# Patient Record
Sex: Male | Born: 1945 | Race: White | Hispanic: No | Smoking: Current every day smoker
Health system: Southern US, Community
[De-identification: ages and names within clinical notes are randomized; demographics above are authoritative.]

## PROBLEM LIST (undated history)

## (undated) DIAGNOSIS — J449 Chronic obstructive pulmonary disease, unspecified: Secondary | ICD-10-CM

## (undated) DIAGNOSIS — M545 Low back pain, unspecified: Secondary | ICD-10-CM

## (undated) DIAGNOSIS — F329 Major depressive disorder, single episode, unspecified: Secondary | ICD-10-CM

## (undated) DIAGNOSIS — Z720 Tobacco use: Secondary | ICD-10-CM

## (undated) DIAGNOSIS — Z972 Presence of dental prosthetic device (complete) (partial): Secondary | ICD-10-CM

## (undated) DIAGNOSIS — R972 Elevated prostate specific antigen [PSA]: Secondary | ICD-10-CM

## (undated) DIAGNOSIS — K759 Inflammatory liver disease, unspecified: Secondary | ICD-10-CM

## (undated) DIAGNOSIS — I10 Essential (primary) hypertension: Secondary | ICD-10-CM

## (undated) DIAGNOSIS — F32A Depression, unspecified: Secondary | ICD-10-CM

## (undated) DIAGNOSIS — H409 Unspecified glaucoma: Secondary | ICD-10-CM

## (undated) HISTORY — DX: Major depressive disorder, single episode, unspecified: F32.9

## (undated) HISTORY — PX: COLONOSCOPY: SHX174

## (undated) HISTORY — PX: OTHER SURGICAL HISTORY: SHX169

## (undated) HISTORY — DX: Essential (primary) hypertension: I10

## (undated) HISTORY — PX: TONSILLECTOMY: SUR1361

## (undated) HISTORY — DX: Inflammatory liver disease, unspecified: K75.9

## (undated) HISTORY — PX: BACK SURGERY: SHX140

## (undated) HISTORY — DX: Unspecified glaucoma: H40.9

## (undated) HISTORY — DX: Depression, unspecified: F32.A

## (undated) HISTORY — DX: Elevated prostate specific antigen (PSA): R97.20

---

## 2006-12-03 ENCOUNTER — Emergency Department (HOSPITAL_COMMUNITY): Admission: EM | Admit: 2006-12-03 | Discharge: 2006-12-03 | Payer: Self-pay | Admitting: Emergency Medicine

## 2011-11-16 ENCOUNTER — Ambulatory Visit: Payer: Self-pay | Admitting: Ophthalmology

## 2011-12-14 ENCOUNTER — Ambulatory Visit: Payer: Self-pay | Admitting: Ophthalmology

## 2012-11-14 HISTORY — PX: HERNIA REPAIR: SHX51

## 2013-04-30 ENCOUNTER — Ambulatory Visit: Payer: Self-pay | Admitting: Family Medicine

## 2013-05-30 HISTORY — PX: LAPAROSCOPIC PARTIAL COLECTOMY: SHX5907

## 2013-11-14 HISTORY — PX: GLAUCOMA SURGERY: SHX656

## 2017-06-26 ENCOUNTER — Other Ambulatory Visit: Payer: Self-pay | Admitting: Family Medicine

## 2017-06-26 DIAGNOSIS — I10 Essential (primary) hypertension: Secondary | ICD-10-CM

## 2017-06-26 DIAGNOSIS — R519 Headache, unspecified: Secondary | ICD-10-CM

## 2017-06-26 DIAGNOSIS — R51 Headache: Principal | ICD-10-CM

## 2017-07-03 ENCOUNTER — Ambulatory Visit
Admission: RE | Admit: 2017-07-03 | Discharge: 2017-07-03 | Disposition: A | Discharge status: Home / Self Care | Payer: Medicare Other | Source: Ambulatory Visit | Attending: Family Medicine | Admitting: Family Medicine

## 2017-07-03 DIAGNOSIS — I639 Cerebral infarction, unspecified: Secondary | ICD-10-CM | POA: Insufficient documentation

## 2017-07-03 DIAGNOSIS — I10 Essential (primary) hypertension: Secondary | ICD-10-CM | POA: Insufficient documentation

## 2017-07-03 DIAGNOSIS — R51 Headache: Secondary | ICD-10-CM | POA: Insufficient documentation

## 2017-07-03 DIAGNOSIS — R519 Headache, unspecified: Secondary | ICD-10-CM

## 2017-07-03 MED ORDER — IOPAMIDOL (ISOVUE-300) INJECTION 61%
75.0000 mL | Freq: Once | INTRAVENOUS | Status: AC | PRN
  Administered 2017-07-03: 75 mL via INTRAVENOUS

## 2017-07-21 ENCOUNTER — Ambulatory Visit (INDEPENDENT_AMBULATORY_CARE_PROVIDER_SITE_OTHER): Payer: Medicare Other | Admitting: Urology

## 2017-07-21 ENCOUNTER — Other Ambulatory Visit
Admission: RE | Admit: 2017-07-21 | Discharge: 2017-07-21 | Disposition: A | Discharge status: Home / Self Care | Payer: Medicare Other | Source: Ambulatory Visit | Attending: Urology | Admitting: Urology

## 2017-07-21 ENCOUNTER — Encounter: Payer: Self-pay | Admitting: Urology

## 2017-07-21 VITALS — BP 151/72 | HR 74 | Ht 70.0 in | Wt 130.0 lb

## 2017-07-21 DIAGNOSIS — R972 Elevated prostate specific antigen [PSA]: Secondary | ICD-10-CM | POA: Insufficient documentation

## 2017-07-21 DIAGNOSIS — N401 Enlarged prostate with lower urinary tract symptoms: Secondary | ICD-10-CM | POA: Diagnosis not present

## 2017-07-21 DIAGNOSIS — R3915 Urgency of urination: Secondary | ICD-10-CM | POA: Diagnosis not present

## 2017-07-21 DIAGNOSIS — N3000 Acute cystitis without hematuria: Secondary | ICD-10-CM

## 2017-07-21 LAB — URINALYSIS, COMPLETE (UACMP) WITH MICROSCOPIC
BILIRUBIN URINE: NEGATIVE
Glucose, UA: NEGATIVE mg/dL
HGB URINE DIPSTICK: NEGATIVE
KETONES UR: NEGATIVE mg/dL
LEUKOCYTES UA: NEGATIVE
NITRITE: POSITIVE — AB
Protein, ur: NEGATIVE mg/dL
RBC / HPF: NONE SEEN RBC/hpf (ref 0–5)
Specific Gravity, Urine: >1.03 — ABNORMAL HIGH (ref 1.005–1.030)
pH: 5 (ref 5.0–8.0)

## 2017-07-21 LAB — BLADDER SCAN AMB NON-IMAGING: Scan Result: 25

## 2017-07-21 MED ORDER — TAMSULOSIN HCL 0.4 MG PO CAPS: 0.4000 mg | ORAL_CAPSULE | Freq: Every day | ORAL | 11 refills | Status: DC

## 2017-07-21 NOTE — Progress Notes (Signed)
07/21/2017 4:41 PM   Christopher Macdonald Jul 18, 1946 [id number]  Referring provider: Rayetta Humphrey, MD [address] [place], [place] [address]  Chief Complaint  Patient presents with  . New Patient (Initial Visit)    elevated psa referred by Dr. Greggory Stallion  PSA 4.27    HPI: 71 year old male referred for further evaluation of elevated PSA.  He recently underwent a screening PSA by his PCP on 06/22/2017 at which time his PSA was 4.47. Prior to this, his last PSA was in 2014 which is 2.55.  He does have some baseline urinary symptoms which are chronic. He endorses daytime urinary frequency with occasional urgency. He also gets up at night to void, 2-5 times depending on the amount of soda he drinks before bed. He also notes that his stream is somewhat weekend. He does feel like he is able to empty his bladder completely. No dysuria or gross hematuria. No history of UTIs.  PVR today minimal.  No family history of prostate cancer.      IPSS    Row Name 07/21/17 1300         International Prostate Symptom Score   How often have you had the sensation of not emptying your bladder? Not at All     How often have you had to urinate less than every two hours? Less than half the time     How often have you found you stopped and started again several times when you urinated? Not at All     How often have you found it difficult to postpone urination? Almost always     How often have you had a weak urinary stream? Almost always     How often have you had to strain to start urination? About half the time     How many times did you typically get up at night to urinate? 2 Times     Total IPSS Score 17       Quality of Life due to urinary symptoms   If you were to spend the rest of your life with your urinary condition just the way it is now how would you feel about that? Unhappy        Score:  1-7 Mild 8-19 Moderate 20-35 Severe      PMH: Past Medical History:  Diagnosis Date  .  Depression   . Elevated PSA   . Glaucoma   . Hepatitis   . HTN (hypertension)     Surgical History: Past Surgical History:  Procedure Laterality Date  . cataract sugery Right   . GLAUCOMA SURGERY  2015  . HERNIA REPAIR  2014    Home Medications:  Allergies as of 07/21/2017   Not on File     Medication List       Accurate as of 07/21/17  4:41 PM. Always use your most recent med list.          albuterol 108 (90 Base) MCG/ACT inhaler Commonly known as:  PROVENTIL HFA;VENTOLIN HFA Inhale into the lungs.   brimonidine 0.2 % ophthalmic solution Commonly known as:  ALPHAGAN   dorzolamide 2 % ophthalmic solution Commonly known as:  TRUSOPT   ibuprofen 600 MG tablet Commonly known as:  ADVIL,MOTRIN as needed.   latanoprost 0.005 % ophthalmic solution Commonly known as:  XALATAN   losartan 50 MG tablet Commonly known as:  COZAAR Take by mouth.   SUMAtriptan 50 MG tablet Commonly known as:  IMITREX Take 50 mg  by mouth as needed for migraine. May repeat in 2 hours if headache persists or recurs.   tamsulosin 0.4 MG Caps capsule Commonly known as:  FLOMAX Take 1 capsule (0.4 mg total) by mouth daily.            Discharge Care Instructions        Start     Ordered   07/21/17 0000  BLADDER SCAN AMB NON-IMAGING     07/21/17 1349   07/21/17 0000  tamsulosin (FLOMAX) 0.4 MG CAPS capsule  Daily     07/21/17 1400   07/21/17 0000  PSA     07/21/17 1406   Unscheduled  Urinalysis, Complete w Microscopic     07/21/17 1349      Allergies: Not on File  Family History: Family History  Problem Relation Age of Onset  . Prostate cancer Neg Hx   . Kidney cancer Neg Hx   . Bladder Cancer Neg Hx     Social History:  reports that he has been smoking.  He has quit using smokeless tobacco. His smokeless tobacco use included Chew. He reports that he does not drink alcohol or use drugs.  ROS: UROLOGY Frequent Urination?: Yes Hard to postpone urination?:  Yes Burning/pain with urination?: No Get up at night to urinate?: Yes Leakage of urine?: No Urine stream starts and stops?: No Trouble starting stream?: Yes Do you have to strain to urinate?: Yes Blood in urine?: No Urinary tract infection?: No Sexually transmitted disease?: No Injury to kidneys or bladder?: No Painful intercourse?: No Weak stream?: Yes Erection problems?: Yes Penile pain?: No  Gastrointestinal Nausea?: No Vomiting?: No Indigestion/heartburn?: No Diarrhea?: Yes Constipation?: No  Constitutional Fever: No Night sweats?: Yes Weight loss?: Yes Fatigue?: Yes  Skin Skin rash/lesions?: No Itching?: No  Eyes Blurred vision?: No Double vision?: No  Ears/Nose/Throat Sore throat?: No Sinus problems?: No  Hematologic/Lymphatic Swollen glands?: No Easy bruising?: Yes  Cardiovascular Leg swelling?: Yes Chest pain?: No  Respiratory Cough?: Yes Shortness of breath?: Yes  Endocrine Excessive thirst?: No  Musculoskeletal Back pain?: Yes Joint pain?: No  Neurological Headaches?: Yes Dizziness?: No  Psychologic Depression?: Yes Anxiety?: Yes  Physical Exam: BP (!) 151/72   Pulse 74   Ht  (1.778 m)   Wt 130 lb (59 kg)   BMI 18.65 kg/m   Constitutional:  Alert and oriented, No acute distress.  Thin. Pack of cigarettes in pocket. HEENT: [place] AT, moist mucus membranes.  Trachea midline, no masses. Cardiovascular: No clubbing, cyanosis, or edema. Respiratory: Normal respiratory effort, no increased work of breathing. GI: Abdomen is soft, nontender, nondistended, no abdominal masses GU: No CVA tenderness. Circumcised phallus with orthotopic meatus. Testicles descended bilaterally. Rectal: Normal sphincter tone. 45 cc prostate, nontender, no nodules. Skin: No rashes, bruises or suspicious lesions. Neurologic: Grossly intact, no focal deficits, moving all 4 extremities. Psychiatric: Normal mood and affect.  Laboratory Data: PSA as  above  Urinalysis Component     Latest Ref Rng & Units 07/21/2017  Color, Urine     YELLOW YELLOW  Appearance     CLEAR HAZY (A)  Specific Gravity, Urine     1.005 - 1.030 >1.030 (H)  pH     5.0 - 8.0 5.0  Glucose     NEGATIVE mg/dL NEGATIVE  Hgb urine dipstick     NEGATIVE NEGATIVE  Bilirubin Urine     NEGATIVE NEGATIVE  Ketones, ur     NEGATIVE mg/dL NEGATIVE  Protein  NEGATIVE mg/dL NEGATIVE  Nitrite     NEGATIVE POSITIVE (A)  Leukocytes, UA     NEGATIVE NEGATIVE  Squamous Epithelial / LPF     NONE SEEN 0-5 (A)  WBC, UA     0 - 5 WBC/hpf 6-30  RBC / HPF     0 - 5 RBC/hpf NONE SEEN  Bacteria, UA     NONE SEEN MANY (A)    Pertinent Imaging: Results for orders placed or performed in visit on 07/21/17  BLADDER SCAN AMB NON-IMAGING  Result Value Ref Range   Scan Result 25      Assessment & Plan:    1. Elevated PSA  We reviewed the implications of an elevated PSA and the uncertainty surrounding it. In general, a man's PSA increases with age and is produced by both normal and cancerous prostate tissue. Differential for elevated PSA is BPH, prostate cancer, infection, recent intercourse/ejaculation, prostate infarction, recent urethroscopic manipulation (foley placement/cystoscopy) and prostatitis. Management of an elevated PSA can include observation or prostate biopsy and wediscussed this in detail. We discussed that indications for prostate biopsy are defined by age and race specific PSA cutoffs as well as a PSA velocity of 0.75/year.  UA today suspicious for infection, likely contributing factor to his PSA. We'll send urine culture and treat as needed. Plan for repeat PSA in 3 months.  - BLADDER SCAN AMB NON-IMAGING - Urinalysis, Complete w Microscopic; Future - PSA; Future  2. Benign prostatic hyperplasia (BPH) with urinary urgency Prostate somewhat enlarged with some obstructive voiding symptoms Discussed initiation of Flomax therapy,  Prescription sent  to pharmacy, reviewed side effects  3. Acute cystitis without hematuria UA positive today for infection, culture sent Treatment based on culture and sensitivity data given no acute symptoms May be contributing to chronic urinary symptoms, reassess at next visit after treatment   Return in about 3 months (around 10/20/2017) for PSA .  Vanna ScotlandAshley Pattrick Bady, MD  [facility] [address], [address] [place], [place] [address] [phone]

## 2017-07-21 NOTE — Patient Instructions (Signed)
1. Try flomax for urinary symptoms- prescription sent to [facility]  2. Repeat blood work at our lab in 3 months at least one week before your follow up  3. Drop off urine today by lab to rule out infection

## 2017-07-24 ENCOUNTER — Telehealth: Payer: Self-pay

## 2017-07-24 LAB — URINE CULTURE
Culture: >=100000 — AB
Special Requests: NORMAL

## 2017-07-24 MED ORDER — SULFAMETHOXAZOLE-TRIMETHOPRIM 800-160 MG PO TABS: 1.0000 | ORAL_TABLET | Freq: Two times a day (BID) | ORAL | 0 refills | Status: DC

## 2017-07-24 NOTE — Telephone Encounter (Signed)
Patient notified script sent to pharm

## 2017-07-24 NOTE — Telephone Encounter (Signed)
-----   Message from Vanna ScotlandAshley Brandon, MD sent at 07/24/2017  2:11 PM EDT ----- Please make patient aware of E. Coli UTI and treat with bactrim ds bid x 7 day.  This may be cause of elevated PSA.    Vanna ScotlandAshley Brandon, MD

## 2017-08-18 ENCOUNTER — Other Ambulatory Visit (INDEPENDENT_AMBULATORY_CARE_PROVIDER_SITE_OTHER): Payer: Self-pay | Admitting: Vascular Surgery

## 2017-08-18 DIAGNOSIS — M79606 Pain in leg, unspecified: Secondary | ICD-10-CM

## 2017-08-21 ENCOUNTER — Ambulatory Visit (INDEPENDENT_AMBULATORY_CARE_PROVIDER_SITE_OTHER): Payer: Medicare Other

## 2017-08-21 ENCOUNTER — Ambulatory Visit (INDEPENDENT_AMBULATORY_CARE_PROVIDER_SITE_OTHER): Payer: Medicare Other | Admitting: Vascular Surgery

## 2017-08-21 ENCOUNTER — Other Ambulatory Visit (INDEPENDENT_AMBULATORY_CARE_PROVIDER_SITE_OTHER): Payer: Medicare Other

## 2017-08-21 ENCOUNTER — Encounter (INDEPENDENT_AMBULATORY_CARE_PROVIDER_SITE_OTHER): Payer: Self-pay | Admitting: Vascular Surgery

## 2017-08-21 DIAGNOSIS — M79605 Pain in left leg: Secondary | ICD-10-CM | POA: Diagnosis not present

## 2017-08-21 DIAGNOSIS — I70213 Atherosclerosis of native arteries of extremities with intermittent claudication, bilateral legs: Secondary | ICD-10-CM

## 2017-08-21 DIAGNOSIS — M79606 Pain in leg, unspecified: Secondary | ICD-10-CM

## 2017-08-21 DIAGNOSIS — M79604 Pain in right leg: Secondary | ICD-10-CM | POA: Diagnosis not present

## 2017-08-21 DIAGNOSIS — I10 Essential (primary) hypertension: Secondary | ICD-10-CM

## 2017-08-21 DIAGNOSIS — J439 Emphysema, unspecified: Secondary | ICD-10-CM

## 2017-08-26 DIAGNOSIS — I10 Essential (primary) hypertension: Secondary | ICD-10-CM | POA: Insufficient documentation

## 2017-08-26 DIAGNOSIS — J449 Chronic obstructive pulmonary disease, unspecified: Secondary | ICD-10-CM | POA: Insufficient documentation

## 2017-08-26 DIAGNOSIS — I70219 Atherosclerosis of native arteries of extremities with intermittent claudication, unspecified extremity: Secondary | ICD-10-CM | POA: Insufficient documentation

## 2017-08-26 DIAGNOSIS — M79606 Pain in leg, unspecified: Secondary | ICD-10-CM | POA: Insufficient documentation

## 2017-08-26 NOTE — Progress Notes (Signed)
MRN : [id number]  Christopher Macdonald is a 71 y.o. (03-21-1946) male who presents with chief complaint of  Chief Complaint  Patient presents with  . New Patient (Initial Visit)    Ultrasound for leg pain  .  History of Present Illness: The patient is seen for evaluation of painful lower extremities and diminished pulses. Patient notes the pain is always associated with activity and is very consistent day today. Typically, the pain occurs at less than one block, progress is as activity continues to the point that the patient must stop walking. Resting including standing still for several minutes allowed resumption of the activity and the ability to walk a similar distance before stopping again. Uneven terrain and inclined shorten the distance. The pain has been progressive over the past several years. The patient states the inability to walk is now having a profound negative impact on quality of life and daily activities.  The patient denies rest pain or dangling of an extremity off the side of the bed during the night for relief. No open wounds or sores at this time. No prior interventions or surgeries.  No history of back problems or DJD of the lumbar sacral spine.   The patient denies changes in claudication symptoms or new rest pain symptoms.  No new ulcers or wounds of the foot.  The patient's blood pressure has been stable and relatively well controlled. The patient denies amaurosis fugax or recent TIA symptoms. There are no recent neurological changes noted. The patient denies history of DVT, PE or superficial thrombophlebitis. The patient denies recent episodes of angina or shortness of breath.   Current Meds  Medication Sig  . albuterol (PROVENTIL HFA;VENTOLIN HFA) 108 (90 Base) MCG/ACT inhaler Inhale into the lungs.  [place] Kitchen aspirin EC 81 MG tablet Take by mouth.  . brimonidine (ALPHAGAN) 0.2 % ophthalmic solution   . dorzolamide (TRUSOPT) 2 % ophthalmic solution   . gabapentin  (NEURONTIN) 100 MG capsule   . ibuprofen (ADVIL,MOTRIN) 600 MG tablet as needed.   . latanoprost (XALATAN) 0.005 % ophthalmic solution   . losartan (COZAAR) 50 MG tablet Take by mouth.  . sulfamethoxazole-trimethoprim (BACTRIM DS,SEPTRA DS) 800-160 MG tablet Take 1 tablet by mouth every 12 (twelve) hours.  . SUMAtriptan (IMITREX) 50 MG tablet Take 50 mg by mouth as needed for migraine. May repeat in 2 hours if headache persists or recurs.  . tamsulosin (FLOMAX) 0.4 MG CAPS capsule Take 1 capsule (0.4 mg total) by mouth daily.  . timolol (TIMOPTIC) 0.5 % ophthalmic solution     Past Medical History:  Diagnosis Date  . Depression   . Elevated PSA   . Glaucoma   . Hepatitis   . HTN (hypertension)     Past Surgical History:  Procedure Laterality Date  . cataract sugery Right   . GLAUCOMA SURGERY  2015  . HERNIA REPAIR  2014    Social History Social History  Substance Use Topics  . Smoking status: Current Some Day Smoker  . Smokeless tobacco: Former Neurosurgeon    Types: Chew  . Alcohol use No    Family History Family History  Problem Relation Age of Onset  . Prostate cancer Neg Hx   . Kidney cancer Neg Hx   . Bladder Cancer Neg Hx   No family history of bleeding/clotting disorders, porphyria or autoimmune disease   No Known Allergies   REVIEW OF SYSTEMS (Negative unless checked)  Constitutional: Weight loss  Fever  Chills Cardiac: Chest  pain   Chest pressure   Palpitations   Shortness of breath when laying flat   Shortness of breath with exertion. Vascular:  Pain in legs with walking   Pain in legs at rest  History of DVT   Phlebitis   Swelling in legs   Varicose veins   Non-healing ulcers Pulmonary:   Uses home oxygen   Productive cough   Hemoptysis   Wheeze  COPD   Asthma Neurologic:  Dizziness   Seizures   History of stroke   History of TIA  Aphasia   Vissual changes   Weakness or numbness in arm   Weakness or  numbness in leg Musculoskeletal:   Joint swelling   Joint pain   Low back pain Hematologic:  Easy bruising  Easy bleeding   Hypercoagulable state   Anemic Gastrointestinal:  Diarrhea   Vomiting  Gastroesophageal reflux/heartburn   Difficulty swallowing. Genitourinary:  Chronic kidney disease   Difficult urination  Frequent urination   Blood in urine Skin:  Rashes   Ulcers  Psychological:  History of anxiety    History of major depression.  Physical Examination  Vitals:   08/21/17 1453  BP: (!) 153/76  Pulse: (!) 58  Resp: 16  Weight: 130 lb (59 kg)  Height: 5' 10.5" (1.791 m)   Body mass index is 18.39 kg/m. Gen: WD/WN, NAD Head: [place]/AT, No temporalis wasting.  Ear/Nose/Throat: Hearing grossly intact, nares w/o erythema or drainage, poor dentition Eyes: PER, EOMI, sclera nonicteric.  Neck: Supple, no masses.  No bruit or JVD.  Pulmonary:  Good air movement, clear to auscultation bilaterally, no use of accessory muscles.  Cardiac: RRR, normal S1, S2, no Murmurs. Vascular:  Vessel Right Left  Radial Palpable Palpable  Ulnar Palpable Palpable  Brachial Palpable Palpable  Carotid Palpable Palpable  Femoral Palpable Palpable  Popliteal Not Palpable Not Palpable  PT Not Palpable Not Palpable  DP Not Palpable Not Palpable   Gastrointestinal: soft, non-distended. No guarding/no peritoneal signs.  Musculoskeletal: M/S 5/5 throughout.  No deformity or atrophy.  Neurologic: CN 2-12 intact. Pain and light touch intact in extremities.  Symmetrical.  Speech is fluent. Motor exam as listed above. Psychiatric: Judgment intact, Mood & affect appropriate for pt's clinical situation. Dermatologic: No rashes or ulcers noted.  No changes consistent with cellulitis. Lymph : No Cervical lymphadenopathy, no lichenification or skin changes of chronic lymphedema.  CBC No results found for: WBC, HGB, HCT, MCV, PLT  BMET No results found for: NA, K, CL, CO2,  GLUCOSE, BUN, CREATININE, CALCIUM, GFRNONAA, GFRAA CrCl cannot be calculated (No order found.).  COAG No results found for: INR, PROTIME  Radiology No results found.  Assessment/Plan 1. Atherosclerosis of native artery of both lower extremities with intermittent claudication (HCC)  Recommend:  The patient has evidence of atherosclerosis of the lower extremities with claudication.  The patient does not voice lifestyle limiting changes at this point in time.  Noninvasive studies do not suggest clinically significant change.  No invasive studies, angiography or surgery at this time The patient should continue walking and begin a more formal exercise program.  The patient should continue antiplatelet therapy and aggressive treatment of the lipid abnormalities  No changes in the patient's medications at this time  The patient should continue wearing graduated compression socks 10-15 mmHg strength to control the mild edema.   - VAS [place] ABI WITH/WO TBI; Future - VAS [place] LOWER EXTREMITY ARTERIAL DUPLEX; Future  2. Pain in both lower extremities  Recommend:  The patient has evidence of atherosclerosis of the lower extremities with claudication.  The patient does not voice lifestyle limiting changes at this point in time.  Noninvasive studies do not suggest clinically significant change.  No invasive studies, angiography or surgery at this time The patient should continue walking and begin a more formal exercise program.  The patient should continue antiplatelet therapy and aggressive treatment of the lipid abnormalities  No changes in the patient's medications at this time  The patient should continue wearing graduated compression socks 10-15 mmHg strength to control the mild edema.    3. Pulmonary emphysema, unspecified emphysema type (HCC) Continue pulmonary medications and aerosols as already ordered, these medications have been reviewed and there are no changes at this  time.    4. Essential hypertension Continue antihypertensive medications as already ordered, these medications have been reviewed and there are no changes at this time.     Levora Dredge, MD  08/26/2017 4:02 PM

## 2017-10-16 ENCOUNTER — Other Ambulatory Visit
Admission: RE | Admit: 2017-10-16 | Discharge: 2017-10-16 | Disposition: A | Discharge status: Home / Self Care | Payer: Medicare Other | Source: Ambulatory Visit | Attending: Urology | Admitting: Urology

## 2017-10-16 DIAGNOSIS — R972 Elevated prostate specific antigen [PSA]: Secondary | ICD-10-CM | POA: Diagnosis present

## 2017-10-16 LAB — PSA: Prostatic Specific Antigen: 4.75 ng/mL — ABNORMAL HIGH (ref 0.00–4.00)

## 2017-10-20 ENCOUNTER — Encounter: Payer: Self-pay | Admitting: Urology

## 2017-10-20 ENCOUNTER — Ambulatory Visit (INDEPENDENT_AMBULATORY_CARE_PROVIDER_SITE_OTHER): Payer: Medicare Other | Admitting: Urology

## 2017-10-20 VITALS — BP 157/84 | HR 76 | Ht 70.0 in | Wt 135.0 lb

## 2017-10-20 DIAGNOSIS — I70213 Atherosclerosis of native arteries of extremities with intermittent claudication, bilateral legs: Secondary | ICD-10-CM | POA: Diagnosis not present

## 2017-10-20 DIAGNOSIS — R3915 Urgency of urination: Secondary | ICD-10-CM

## 2017-10-20 DIAGNOSIS — N401 Enlarged prostate with lower urinary tract symptoms: Secondary | ICD-10-CM | POA: Diagnosis not present

## 2017-10-20 DIAGNOSIS — R972 Elevated prostate specific antigen [PSA]: Secondary | ICD-10-CM

## 2017-10-20 NOTE — Progress Notes (Signed)
10/20/2017 7:52 PM   Christopher Macdonald 1946-06-01 [id number]  Referring provider: Rayetta HumphreyGeorge, Sionne A, MD [address] [address] [place], [place] [address]  Chief Complaint  Patient presents with  . Follow-up  . Elevated PSA    HPI: 71 year old male with history of elevated PSA who returns today.  He underwent a screening PSA by his PCP on 06/22/2017 at which time his PSA was 4.47 in the setting of infection.. Prior to this, his last PSA was in 2014 which is 2.55.   PSA repeated today just prior this visit reveal a PSA of 4.75.    Rectal exam last visit 07/16/1028 was unremarkable, 45 g prostate without nodules.    After last visit, his urine culture did grow E. Coli and was appropriately treated with abx.    He does have some baseline urinary symptoms.  He no longer has to get up and void at night, down from 2-5x since starting flomax last visit and treating UTI.  PVR today minimal.  No family history of prostate cancer.   PMH: Past Medical History:  Diagnosis Date  . Depression   . Elevated PSA   . Glaucoma   . Hepatitis   . HTN (hypertension)     Surgical History: Past Surgical History:  Procedure Laterality Date  . cataract sugery Right   . GLAUCOMA SURGERY  2015  . HERNIA REPAIR  2014    Home Medications:  Allergies as of 10/20/2017   No Known Allergies     Medication List        Accurate as of 10/20/17 11:59 PM. Always use your most recent med list.          albuterol 108 (90 Base) MCG/ACT inhaler Commonly known as:  PROVENTIL HFA;VENTOLIN HFA Inhale into the lungs.   aspirin EC 81 MG tablet Take by mouth.   brimonidine 0.2 % ophthalmic solution Commonly known as:  ALPHAGAN   dorzolamide 2 % ophthalmic solution Commonly known as:  TRUSOPT   gabapentin 100 MG capsule Commonly known as:  NEURONTIN   ibuprofen 600 MG tablet Commonly known as:  ADVIL,MOTRIN as needed.   latanoprost 0.005 % ophthalmic solution Commonly known as:  XALATAN     losartan 50 MG tablet Commonly known as:  COZAAR Take by mouth.   SUMAtriptan 50 MG tablet Commonly known as:  IMITREX Take 50 mg by mouth as needed for migraine. May repeat in 2 hours if headache persists or recurs.   tamsulosin 0.4 MG Caps capsule Commonly known as:  FLOMAX Take 1 capsule (0.4 mg total) by mouth daily.   timolol 0.5 % ophthalmic solution Commonly known as:  TIMOPTIC       Allergies: No Known Allergies  Family History: Family History  Problem Relation Age of Onset  . Prostate cancer Neg Hx   . Kidney cancer Neg Hx   . Bladder Cancer Neg Hx     Social History:  reports that he has been smoking.  He has quit using smokeless tobacco. His smokeless tobacco use included chew. He reports that he does not drink alcohol or use drugs.  ROS: UROLOGY Frequent Urination?: No Hard to postpone urination?: No Burning/pain with urination?: No Get up at night to urinate?: No Leakage of urine?: No Urine stream starts and stops?: No Trouble starting stream?: No Do you have to strain to urinate?: No Blood in urine?: No Urinary tract infection?: No Sexually transmitted disease?: No Injury to kidneys or bladder?: No Painful intercourse?: No  Weak stream?: Yes Erection problems?: Yes Penile pain?: No  Gastrointestinal Nausea?: No Vomiting?: No Indigestion/heartburn?: No Diarrhea?: Yes Constipation?: No  Constitutional Fever: No Night sweats?: No Weight loss?: No Fatigue?: No  Skin Skin rash/lesions?: No Itching?: No  Eyes Blurred vision?: No Double vision?: No  Ears/Nose/Throat Sore throat?: No Sinus problems?: No  Hematologic/Lymphatic Easy bruising?: No  Cardiovascular Leg swelling?: No Chest pain?: No  Respiratory Cough?: No Shortness of breath?: Yes  Endocrine Excessive thirst?: No  Musculoskeletal Back pain?: Yes Joint pain?: No  Neurological Headaches?: Yes Dizziness?: No  Psychologic Depression?: Yes Anxiety?:  No  Physical Exam: BP (!) 157/84   Pulse 76   Ht 5\' 10"  (1.778 m)   Wt 135 lb (61.2 kg)   BMI 19.37 kg/m   Constitutional:  Alert and oriented, No acute distress.  Thin. Pack of cigarettes in pocket. HEENT: [place] AT, moist mucus membranes.  Trachea midline, no masses. Cardiovascular: No clubbing, cyanosis, or edema. Respiratory: Normal respiratory effort, no increased work of breathing. Skin: No rashes, bruises or suspicious lesions. Neurologic: Grossly intact, no focal deficits, moving all 4 extremities. Psychiatric: Normal mood and affect.  Laboratory Data: PSA as above  Urinalysis Results for orders placed or performed during the hospital encounter of 10/16/17  PSA  Result Value Ref Range   Prostatic Specific Antigen 4.75 (H) 0.00 - 4.00 ng/mL    Component     Latest Ref Rng & Units 10/26/2017  Color, Urine     YELLOW YELLOW  Appearance     CLEAR CLEAR  Specific Gravity, Urine     1.005 - 1.030 >1.030 (H)  pH     5.0 - 8.0 5.5  Glucose     NEGATIVE mg/dL NEGATIVE  Hgb urine dipstick     NEGATIVE NEGATIVE  Bilirubin Urine     NEGATIVE NEGATIVE  Ketones, ur     NEGATIVE mg/dL NEGATIVE  Protein     NEGATIVE mg/dL NEGATIVE  Nitrite     NEGATIVE NEGATIVE  Leukocytes, UA     NEGATIVE NEGATIVE  Squamous Epithelial / LPF     NONE SEEN 0-5 (A)  WBC, UA     0 - 5 WBC/hpf 0-5  RBC / HPF     0 - 5 RBC/hpf 0-5  Bacteria, UA     NONE SEEN RARE (A)  Mucus      PRESENT     Pertinent Imaging: Results for orders placed or performed in visit on 07/21/17  BLADDER SCAN AMB NON-IMAGING  Result Value Ref Range   Scan Result 25      Assessment & Plan:    1. Elevated PSA PSA remains elevated 3 months later after treating UTI ? Residual prostatic inflammation as contributing factor Recommend serial PSA in 3 months, if remains elevated, will more strongly recommend prostate biopsy He is agreeable with this plan  2. Benign prostatic hyperplasia (BPH) with urinary  urgency Improving with flomax  Return in about 3 months (around 01/18/2018), or PSA.  Vanna ScotlandAshley Kalyan Barabas, MD  [facility] [address], [address] [place], [place] [address] [phone]

## 2017-10-26 ENCOUNTER — Other Ambulatory Visit: Payer: Self-pay | Admitting: Neurology

## 2017-10-26 ENCOUNTER — Other Ambulatory Visit
Admission: RE | Admit: 2017-10-26 | Discharge: 2017-10-26 | Disposition: A | Discharge status: Home / Self Care | Payer: Medicare Other | Source: Ambulatory Visit | Attending: Urology | Admitting: Urology

## 2017-10-26 DIAGNOSIS — R9389 Abnormal findings on diagnostic imaging of other specified body structures: Secondary | ICD-10-CM

## 2017-10-26 DIAGNOSIS — N401 Enlarged prostate with lower urinary tract symptoms: Secondary | ICD-10-CM | POA: Insufficient documentation

## 2017-10-26 DIAGNOSIS — R3915 Urgency of urination: Secondary | ICD-10-CM | POA: Insufficient documentation

## 2017-10-26 DIAGNOSIS — R972 Elevated prostate specific antigen [PSA]: Secondary | ICD-10-CM

## 2017-10-26 LAB — URINALYSIS, COMPLETE (UACMP) WITH MICROSCOPIC
Bilirubin Urine: NEGATIVE
GLUCOSE, UA: NEGATIVE mg/dL
HGB URINE DIPSTICK: NEGATIVE
Ketones, ur: NEGATIVE mg/dL
Leukocytes, UA: NEGATIVE
NITRITE: NEGATIVE
Protein, ur: NEGATIVE mg/dL
pH: 5.5 (ref 5.0–8.0)

## 2017-10-27 LAB — URINE CULTURE: Culture: NO GROWTH

## 2017-11-02 ENCOUNTER — Ambulatory Visit: Payer: Medicare Other

## 2017-11-03 ENCOUNTER — Ambulatory Visit
Admission: RE | Admit: 2017-11-03 | Discharge: 2017-11-03 | Disposition: A | Discharge status: Home / Self Care | Payer: Medicare Other | Source: Ambulatory Visit | Attending: Neurology | Admitting: Neurology

## 2017-11-03 DIAGNOSIS — I2510 Atherosclerotic heart disease of native coronary artery without angina pectoris: Secondary | ICD-10-CM | POA: Diagnosis not present

## 2017-11-03 DIAGNOSIS — R9389 Abnormal findings on diagnostic imaging of other specified body structures: Secondary | ICD-10-CM | POA: Diagnosis present

## 2017-11-03 DIAGNOSIS — S2249XA Multiple fractures of ribs, unspecified side, initial encounter for closed fracture: Secondary | ICD-10-CM | POA: Diagnosis not present

## 2017-11-03 DIAGNOSIS — I700 Atherosclerosis of aorta: Secondary | ICD-10-CM | POA: Diagnosis not present

## 2017-11-03 DIAGNOSIS — M8580 Other specified disorders of bone density and structure, unspecified site: Secondary | ICD-10-CM | POA: Diagnosis not present

## 2017-11-03 DIAGNOSIS — M4854XA Collapsed vertebra, not elsewhere classified, thoracic region, initial encounter for fracture: Secondary | ICD-10-CM | POA: Diagnosis not present

## 2017-11-03 DIAGNOSIS — J479 Bronchiectasis, uncomplicated: Secondary | ICD-10-CM | POA: Diagnosis not present

## 2018-01-19 ENCOUNTER — Other Ambulatory Visit: Payer: Self-pay

## 2018-01-19 ENCOUNTER — Ambulatory Visit (INDEPENDENT_AMBULATORY_CARE_PROVIDER_SITE_OTHER): Payer: Medicare Other | Admitting: Urology

## 2018-01-19 ENCOUNTER — Other Ambulatory Visit
Admission: RE | Admit: 2018-01-19 | Discharge: 2018-01-19 | Disposition: A | Discharge status: Home / Self Care | Payer: Medicare Other | Source: Ambulatory Visit | Attending: Urology | Admitting: Urology

## 2018-01-19 DIAGNOSIS — N401 Enlarged prostate with lower urinary tract symptoms: Secondary | ICD-10-CM

## 2018-01-19 DIAGNOSIS — R972 Elevated prostate specific antigen [PSA]: Secondary | ICD-10-CM

## 2018-01-19 DIAGNOSIS — R3915 Urgency of urination: Secondary | ICD-10-CM | POA: Diagnosis not present

## 2018-01-19 NOTE — Progress Notes (Signed)
01/19/2018 3:34 PM   Christopher Macdonald 08-18-1946 [id number]  Referring provider: Rayetta HumphreyGeorge, Sionne A, MD [address] [place] [address] [place], [place] [id number]  Chief Complaint  Patient presents with  . Benign Prostatic Hypertrophy  . Elevated PSA    HPI: 72 year old male with history of elevated PSA who returns today for follow up.     Rectal exam 07/2017 was unremarkable, 45 g prostate without nodules.    He does have some baseline urinary symptoms.  He no longer has to get up and void at night, down from 2-5x since starting flomax last visit and treating UTI.    He is overall pleased with his voiding symptoms at this time.  No family history of prostate cancer.  No weight loss or bone pain.  PSA trend: 2.55 2014 4.47  06/2017 --> E. coli urinary tract infection 4.75 10/2017, infection cleared Repeat PSA today pending   PMH: Past Medical History:  Diagnosis Date  . Depression   . Elevated PSA   . Glaucoma   . Hepatitis   . HTN (hypertension)     Surgical History: Past Surgical History:  Procedure Laterality Date  . cataract sugery Right   . GLAUCOMA SURGERY  2015  . HERNIA REPAIR  2014    Home Medications:  Allergies as of 01/19/2018   No Known Allergies     Medication List        Accurate as of 01/19/18 11:59 PM. Always use your most recent med list.          albuterol 108 (90 Base) MCG/ACT inhaler Commonly known as:  PROVENTIL HFA;VENTOLIN HFA Inhale into the lungs.   aspirin EC 81 MG tablet Take by mouth.   brimonidine 0.2 % ophthalmic solution Commonly known as:  ALPHAGAN   dorzolamide 2 % ophthalmic solution Commonly known as:  TRUSOPT   gabapentin 300 MG capsule Commonly known as:  NEURONTIN   ibuprofen 600 MG tablet Commonly known as:  ADVIL,MOTRIN as needed.   latanoprost 0.005 % ophthalmic solution Commonly known as:  XALATAN   losartan 50 MG tablet Commonly known as:  COZAAR Take by mouth.   SPIRIVA HANDIHALER 18 MCG inhalation  capsule Generic drug:  tiotropium   SUMAtriptan 50 MG tablet Commonly known as:  IMITREX Take 50 mg by mouth as needed for migraine. May repeat in 2 hours if headache persists or recurs.   tamsulosin 0.4 MG Caps capsule Commonly known as:  FLOMAX Take 1 capsule (0.4 mg total) by mouth daily.   timolol 0.5 % ophthalmic solution Commonly known as:  TIMOPTIC       Allergies: No Known Allergies  Family History: Family History  Problem Relation Age of Onset  . Prostate cancer Neg Hx   . Kidney cancer Neg Hx   . Bladder Cancer Neg Hx     Social History:  reports that he has been smoking.  He has quit using smokeless tobacco. His smokeless tobacco use included chew. He reports that he does not drink alcohol or use drugs.  ROS: UROLOGY Frequent Urination?: No Hard to postpone urination?: No Burning/pain with urination?: No Get up at night to urinate?: No Leakage of urine?: No Urine stream starts and stops?: No Trouble starting stream?: Yes Do you have to strain to urinate?: No Blood in urine?: No Urinary tract infection?: No Sexually transmitted disease?: No Injury to kidneys or bladder?: No Painful intercourse?: No Weak stream?: Yes Erection problems?: Yes Penile pain?: No  Gastrointestinal Nausea?: No Vomiting?:  No Indigestion/heartburn?: No Diarrhea?: No Constipation?: No  Constitutional Fever: No Night sweats?: No Weight loss?: No Fatigue?: Yes  Skin Skin rash/lesions?: No Itching?: No  Eyes Blurred vision?: No Double vision?: No  Ears/Nose/Throat Sore throat?: No Sinus problems?: No  Hematologic/Lymphatic Swollen glands?: No Easy bruising?: No  Cardiovascular Leg swelling?: No Chest pain?: No  Respiratory Cough?: No Shortness of breath?: Yes  Endocrine Excessive thirst?: No  Musculoskeletal Back pain?: Yes Joint pain?: No  Neurological Headaches?: No Dizziness?: No  Psychologic Depression?: No Anxiety?: No  Physical  Exam: Constitutional:  Alert and oriented, No acute distress.  Thin.  HEENT: [place] AT, moist mucus membranes.  Trachea midline, no masses. Cardiovascular: No clubbing, cyanosis, or edema. Respiratory: Normal respiratory effort, no increased work of breathing. Skin: No rashes, bruises or suspicious lesions. Neurologic: Grossly intact, no focal deficits, moving all 4 extremities. Psychiatric: Normal mood and affect.  Laboratory Data: PSA as above  Urinalysis Results for orders placed or performed during the hospital encounter of 10/26/17  Urine culture  Result Value Ref Range   Specimen Description URINE, CLEAN CATCH    Special Requests NONE    Culture      NO GROWTH Performed at [facility], [address] [place], [place] [address]    Report Status 10/27/2017 FINAL   Urinalysis, Complete w Microscopic (For [facility] ONLY)  Result Value Ref Range   Color, Urine YELLOW YELLOW   APPearance CLEAR CLEAR   Specific Gravity, Urine >1.030 (H) 1.005 - 1.030   pH 5.5 5.0 - 8.0   Glucose, UA NEGATIVE NEGATIVE mg/dL   Hgb urine dipstick NEGATIVE NEGATIVE   Bilirubin Urine NEGATIVE NEGATIVE   Ketones, ur NEGATIVE NEGATIVE mg/dL   Protein, ur NEGATIVE NEGATIVE mg/dL   Nitrite NEGATIVE NEGATIVE   Leukocytes, UA NEGATIVE NEGATIVE   Squamous Epithelial / LPF 0-5 (A) NONE SEEN   WBC, UA 0-5 0 - 5 WBC/hpf   RBC / HPF 0-5 0 - 5 RBC/hpf   Bacteria, UA RARE (A) NONE SEEN   Mucus PRESENT     Pertinent Imaging: Results for orders placed or performed in visit on 07/21/17  BLADDER SCAN AMB NON-IMAGING  Result Value Ref Range   Scan Result 25      Assessment & Plan:    1. Elevated PSA As per previous discussion, PSA if PSA remains elevated today or higher, would recommend biopsy of the prostate  We discussed prostate biopsy in detail including the procedure itself, the risks of blood in the urine, stool, and ejaculate, serious infection, and discomfort. He is willing to proceed  with this as discussed.  Will call with PSA results, if more elevated, then plan for biopsy.  He will need to hold ASA for the procedure.  2. Benign prostatic hyperplasia (BPH) with urinary urgency Improving with flomax  As above   Vanna Scotland, MD  [facility] [address] [place], [place] [address] [phone]

## 2018-01-20 LAB — PSA: Prostatic Specific Antigen: 4.86 ng/mL — ABNORMAL HIGH (ref 0.00–4.00)

## 2018-01-22 ENCOUNTER — Telehealth: Payer: Self-pay

## 2018-01-22 NOTE — Telephone Encounter (Signed)
-----   Message from Vanna ScotlandAshley Brandon, MD sent at 01/20/2018  3:40 PM EST ----- PSA remains elevated and in fact is rising slightly.  As such, would recommend proceeding with biopsy as discussed in clinic.  Please schedule.  He will need to hold ASA.  Vanna ScotlandAshley Brandon, MD

## 2018-01-22 NOTE — Telephone Encounter (Signed)
Patient notified and procedure instructions were discussed in detail. Patient previously scheduled and instructions were mailed to patient

## 2018-02-20 ENCOUNTER — Telehealth (INDEPENDENT_AMBULATORY_CARE_PROVIDER_SITE_OTHER): Payer: Self-pay

## 2018-02-20 NOTE — Telephone Encounter (Signed)
The patient called today to state that he would like a call back to reschedule his upcoming appointment on April 11,2019.  He can be reached at [phone].

## 2018-02-22 ENCOUNTER — Encounter (INDEPENDENT_AMBULATORY_CARE_PROVIDER_SITE_OTHER): Payer: Medicare Other

## 2018-02-22 ENCOUNTER — Ambulatory Visit (INDEPENDENT_AMBULATORY_CARE_PROVIDER_SITE_OTHER): Payer: Medicare Other | Admitting: Vascular Surgery

## 2018-03-13 ENCOUNTER — Encounter: Payer: Self-pay | Admitting: Urology

## 2018-03-13 ENCOUNTER — Other Ambulatory Visit: Payer: Self-pay | Admitting: Urology

## 2018-03-13 ENCOUNTER — Ambulatory Visit (INDEPENDENT_AMBULATORY_CARE_PROVIDER_SITE_OTHER): Payer: Medicare Other | Admitting: Urology

## 2018-03-13 VITALS — BP 121/80 | HR 90 | Ht 70.0 in | Wt 140.0 lb

## 2018-03-13 DIAGNOSIS — R972 Elevated prostate specific antigen [PSA]: Secondary | ICD-10-CM

## 2018-03-13 MED ORDER — LEVOFLOXACIN 500 MG PO TABS
500.0000 mg | ORAL_TABLET | Freq: Once | ORAL | Status: AC
  Administered 2018-03-13: 500 mg via ORAL

## 2018-03-13 MED ORDER — GENTAMICIN SULFATE 40 MG/ML IJ SOLN
80.0000 mg | Freq: Once | INTRAMUSCULAR | Status: AC
  Administered 2018-03-13: 80 mg via INTRAMUSCULAR

## 2018-03-13 NOTE — Progress Notes (Signed)
   03/13/18  CC:  Chief Complaint  Patient presents with  . Prostate Biopsy    HPI: 72 year old male who presents today for prostate biopsy in the setting of elevated and rising PSA.  His rectal exam on 07/2017 was enlarged, otherwise unremarkable.  Blood pressure 121/80, pulse 90, height  (1.778 m), weight 140 lb (63.5 kg). NED. A&Ox3.   No respiratory distress   Abd soft, NT, ND Normal sphincter tone  Prostate Biopsy Procedure   Informed consent was obtained after discussing risks/benefits of the procedure.  A time out was performed to ensure correct patient identity.  Pre-Procedure: - Gentamicin given prophylactically - Levaquin 500 mg administered PO -Transrectal Ultrasound performed revealing a 50.3 gm prostate -No significant hypoechoic or median lobe noted -There were circumferential or nodular areas which appear to be most consistent with BPH nodules -Nonspecific hypoechoic lesions   Procedure: - Prostate block performed using 10 cc 1% lidocaine and biopsies taken from sextant areas, a total of 12 under ultrasound guidance.  Post-Procedure: - Patient tolerated the procedure well - He was counseled to seek immediate medical attention if experiences any severe pain, significant bleeding, or fevers - Return in one- two week to discuss biopsy results  Vanna Scotland, MD

## 2018-03-19 ENCOUNTER — Other Ambulatory Visit: Payer: Self-pay | Admitting: Urology

## 2018-03-19 ENCOUNTER — Telehealth: Payer: Self-pay

## 2018-03-19 LAB — PATHOLOGY REPORT

## 2018-03-19 NOTE — Telephone Encounter (Signed)
Patient notified, please schedule for 42mo w/PSA prior

## 2018-03-19 NOTE — Telephone Encounter (Signed)
-----   Message from Vanna Scotland, MD sent at 03/19/2018  3:19 PM EDT ----- Please let this patient know that his prostate biopsy was negative for cancer.  This is awesome news.  I would like him to come back in 6 months with PSA prior.  Please arrange this follow-up.  Vanna Scotland, MD

## 2018-03-21 ENCOUNTER — Telehealth (INDEPENDENT_AMBULATORY_CARE_PROVIDER_SITE_OTHER): Payer: Self-pay

## 2018-03-21 NOTE — Telephone Encounter (Signed)
Patient called stating that he has an appointment set for tomorrow, but he will need to reschedule this appointment because he can not make it.  Please call the patient to reschedule his appointment and move any necessary ultrasounds if needed.

## 2018-03-22 ENCOUNTER — Encounter (INDEPENDENT_AMBULATORY_CARE_PROVIDER_SITE_OTHER): Payer: Medicare Other

## 2018-03-22 ENCOUNTER — Ambulatory Visit (INDEPENDENT_AMBULATORY_CARE_PROVIDER_SITE_OTHER): Payer: Medicare Other | Admitting: Vascular Surgery

## 2018-03-27 ENCOUNTER — Telehealth: Payer: Self-pay | Admitting: Urology

## 2018-03-27 NOTE — Telephone Encounter (Signed)
apps made and mailed to patient ° °Christopher Macdonald °

## 2018-03-30 ENCOUNTER — Ambulatory Visit: Payer: Medicare Other | Admitting: Urology

## 2018-05-12 ENCOUNTER — Ambulatory Visit
Admission: EM | Admit: 2018-05-12 | Discharge: 2018-05-12 | Disposition: A | Discharge status: Home / Self Care | Payer: Medicare Other | Attending: Family Medicine | Admitting: Family Medicine

## 2018-05-12 ENCOUNTER — Other Ambulatory Visit: Payer: Self-pay

## 2018-05-12 DIAGNOSIS — T63461A Toxic effect of venom of wasps, accidental (unintentional), initial encounter: Secondary | ICD-10-CM | POA: Diagnosis not present

## 2018-05-12 DIAGNOSIS — T7840XA Allergy, unspecified, initial encounter: Secondary | ICD-10-CM | POA: Diagnosis not present

## 2018-05-12 DIAGNOSIS — H409 Unspecified glaucoma: Secondary | ICD-10-CM | POA: Diagnosis not present

## 2018-05-12 DIAGNOSIS — J449 Chronic obstructive pulmonary disease, unspecified: Secondary | ICD-10-CM | POA: Diagnosis not present

## 2018-05-12 DIAGNOSIS — S40861A Insect bite (nonvenomous) of right upper arm, initial encounter: Secondary | ICD-10-CM | POA: Diagnosis not present

## 2018-05-12 DIAGNOSIS — T63441A Toxic effect of venom of bees, accidental (unintentional), initial encounter: Secondary | ICD-10-CM | POA: Diagnosis not present

## 2018-05-12 DIAGNOSIS — I10 Essential (primary) hypertension: Secondary | ICD-10-CM | POA: Diagnosis not present

## 2018-05-12 DIAGNOSIS — I70219 Atherosclerosis of native arteries of extremities with intermittent claudication, unspecified extremity: Secondary | ICD-10-CM | POA: Insufficient documentation

## 2018-05-12 DIAGNOSIS — F17210 Nicotine dependence, cigarettes, uncomplicated: Secondary | ICD-10-CM | POA: Insufficient documentation

## 2018-05-12 DIAGNOSIS — S40862A Insect bite (nonvenomous) of left upper arm, initial encounter: Secondary | ICD-10-CM | POA: Diagnosis not present

## 2018-05-12 DIAGNOSIS — R0789 Other chest pain: Secondary | ICD-10-CM | POA: Diagnosis not present

## 2018-05-12 MED ORDER — TRIAMCINOLONE ACETONIDE 0.1 % EX CREA: 1.0000 "application " | TOPICAL_CREAM | Freq: Two times a day (BID) | CUTANEOUS | 0 refills | Status: DC

## 2018-05-12 MED ORDER — METHYLPREDNISOLONE SODIUM SUCC 125 MG IJ SOLR
125.0000 mg | Freq: Once | INTRAMUSCULAR | Status: AC
  Administered 2018-05-12: 125 mg via INTRAMUSCULAR

## 2018-05-12 MED ORDER — DIPHENHYDRAMINE HCL 50 MG/ML IJ SOLN: 25.0000 mg | Freq: Once | INTRAMUSCULAR | Status: DC

## 2018-05-12 MED ORDER — FAMOTIDINE IN NACL 20-0.9 MG/50ML-% IV SOLN: 20.0000 mg | Freq: Once | INTRAVENOUS | Status: DC

## 2018-05-12 MED ORDER — FAMOTIDINE 20 MG PO TABS: 20.0000 mg | ORAL_TABLET | Freq: Two times a day (BID) | ORAL | 0 refills | Status: DC

## 2018-05-12 MED ORDER — CETIRIZINE HCL 5 MG PO TABS: 5.0000 mg | ORAL_TABLET | Freq: Every day | ORAL | 0 refills | Status: DC

## 2018-05-12 MED ORDER — PREDNISONE 20 MG PO TABS: 40.0000 mg | ORAL_TABLET | Freq: Every day | ORAL | 0 refills | Status: AC

## 2018-05-12 MED ORDER — FAMOTIDINE 20 MG PO TABS
20.0000 mg | ORAL_TABLET | Freq: Once | ORAL | Status: AC
  Administered 2018-05-12: 20 mg via ORAL

## 2018-05-12 MED ORDER — DIPHENHYDRAMINE HCL 25 MG PO CAPS
25.0000 mg | ORAL_CAPSULE | Freq: Once | ORAL | Status: AC
  Administered 2018-05-12: 25 mg via ORAL

## 2018-05-12 NOTE — ED Triage Notes (Addendum)
Pt was stung by at least 5 wasps an hour ago on his arms. Pt with redness, swelling to arms and hands, lips swelling, hard to take a "good deep breath" Pt able to speak in complete sentences

## 2018-05-12 NOTE — ED Provider Notes (Signed)
[facility]    CSN: [id number] Arrival date & time: 05/12/18  1234     History   Chief Complaint Chief Complaint  Patient presents with  . Allergic Reaction    HPI Christopher Macdonald is a 72 y.o. male.   Christopher Macdonald presents with complaints of reaction to wasp bites today approximately 1.5 hours ago. [place] he thinks he stepped into a nest or similar, bit at least 5 times by wasps. Arms bilaterally with swelling, redness and itching. States he felt shortness of breath immediately after, used his inhaler and this has since improved. States he felt like his lips became swollen and it feels noticeable to swallow. States had similar reaction years ago. Also complaints of sternal chest pain , 6/10. Constant in nature. Feels like heart burn. No nausea, radiation to arm or jaw. Arms feels itchy but without pain. Hx of COPD, still smokes. Hx of htn, glaucoma, hepatitis.    ROS per HPI.      Past Medical History:  Diagnosis Date  . Depression   . Elevated PSA   . Glaucoma   . Hepatitis   . HTN (hypertension)     Patient Active Problem List   Diagnosis Date Noted  . Atherosclerosis of native arteries of extremity with intermittent claudication (HCC) 08/26/2017  . Leg pain 08/26/2017  . COPD (chronic obstructive pulmonary disease) (HCC) 08/26/2017  . Essential hypertension 08/26/2017    Past Surgical History:  Procedure Laterality Date  . cataract sugery Right   . GLAUCOMA SURGERY  2015  . HERNIA REPAIR  2014       Home Medications    Prior to Admission medications   Medication Sig Start Date End Date Taking? Authorizing Provider  albuterol (PROVENTIL HFA;VENTOLIN HFA) 108 (90 Base) MCG/ACT inhaler Inhale into the lungs. 06/22/17 06/22/18  [provider]  aspirin EC 81 MG tablet Take by mouth.    [provider]  brimonidine (ALPHAGAN) 0.2 % ophthalmic solution  06/20/17   [provider]  cetirizine  (ZYRTEC) 5 MG tablet Take 1 tablet (5 mg total) by mouth daily for 10 days. 05/12/18 05/22/18  Georgetta Haber, NP  dorzolamide (TRUSOPT) 2 % ophthalmic solution  06/20/17   [provider]  famotidine (PEPCID) 20 MG tablet Take 1 tablet (20 mg total) by mouth 2 (two) times daily for 10 days. 05/12/18 05/22/18  Georgetta Haber, NP  gabapentin (NEURONTIN) 300 MG capsule  01/03/18   [provider]  ibuprofen (ADVIL,MOTRIN) 600 MG tablet as needed.  04/16/14   [provider]  latanoprost (XALATAN) 0.005 % ophthalmic solution  05/03/17   [provider]  losartan (COZAAR) 50 MG tablet Take by mouth. 06/29/17 06/29/18  [provider]  nortriptyline (PAMELOR) 10 MG capsule  03/08/18   [provider]  predniSONE (DELTASONE) 20 MG tablet Take 2 tablets (40 mg total) by mouth daily with breakfast for 5 days. 05/12/18 05/17/18  Georgetta Haber, NP  SPIRIVA HANDIHALER 18 MCG inhalation capsule  01/03/18   [provider]  SUMAtriptan (IMITREX) 50 MG tablet Take 50 mg by mouth as needed for migraine. May repeat in 2 hours if headache persists or recurs.    [provider]  tamsulosin (FLOMAX) 0.4 MG CAPS capsule Take 1 capsule (0.4 mg total) by mouth daily. 07/21/17   Vanna Scotland, MD  timolol (TIMOPTIC) 0.5 % ophthalmic solution  07/07/17   [provider]  triamcinolone cream (KENALOG) 0.1 % Apply 1 application topically 2 (two) times daily. 05/12/18   Georgetta HaberBurky, Aadil Sur B, NP    Family History Family History  Problem Relation Age of Onset  . Prostate cancer Neg Hx   . Kidney cancer Neg Hx   . Bladder Cancer Neg Hx     Social History Social History   Tobacco Use  . Smoking status: Current Every Day Smoker    Packs/day: 1.00    Types: Cigarettes  . Smokeless tobacco: Former NeurosurgeonUser    Types: Chew  Substance Use Topics  . Alcohol use: No  . Drug use: No     Allergies   Wasp venom   Review of Systems Review of  Systems   Physical Exam Triage Vital Signs ED Triage Vitals  Enc Vitals Group     BP 05/12/18 1239 121/73     Pulse Rate 05/12/18 1239 84     Resp 05/12/18 1239 20     Temp 05/12/18 1244 (!) 97.5 F (36.4 C)     Temp Source 05/12/18 1244 Oral     SpO2 05/12/18 1239 96 %     Weight 05/12/18 1240 140 lb (63.5 kg)     Height 05/12/18 1240 5\' 10"  (1.778 m)     Head Circumference --      Peak Flow --      Pain Score 05/12/18 1240 10     Pain Loc --      Pain Edu? --      Excl. in GC? --    No data found.  Updated Vital Signs BP 137/84 (BP Location: Right Arm)   Pulse 69   Temp (!) 97.5 F (36.4 C) (Oral)   Resp 18   Ht 5\' 10"  (1.778 m)   Wt 140 lb (63.5 kg)   SpO2 99%   BMI 20.09 kg/m    Physical Exam  Constitutional: He is oriented to person, place, and time. He appears well-developed and well-nourished.  HENT:  Mouth/Throat: Uvula is midline, oropharynx is clear and moist and mucous membranes are normal. No uvula swelling.  Swallowing without difficulty; no oral swelling visible  Cardiovascular: Normal rate and regular rhythm.  Pulmonary/Chest: Effort normal and breath sounds normal. No accessory muscle usage. He has no wheezes.  No increased work of breathing, difficulty with speaking, or wheezing noted   Neurological: He is alert and oriented to person, place, and time.  Skin: Skin is warm and dry.  Areas of approximately 4-6 cm in diameter of redness and swelling, non tender, noted to left wrist, left hand, left and right forearm.    EKG with NSR rate of 71 without acute findings  UC Treatments / Results  Labs (all labs ordered are listed, but only abnormal results are displayed) Labs Reviewed - No data to display  EKG None  Radiology No results found.  Procedures Procedures (including critical care time)  Medications Ordered in UC Medications  methylPREDNISolone sodium succinate (SOLU-MEDROL) 125 mg/2 mL injection 125 mg (125 mg Intramuscular Given  05/12/18 1254)  famotidine (PEPCID) tablet 20 mg (20 mg Oral Given 05/12/18 1256)  diphenhydrAMINE (BENADRYL) capsule 25 mg (25 mg Oral Given 05/12/18 1256)    Initial Impression / Assessment and Plan / UC Course  I have reviewed the triage vital signs and the nursing notes.  Pertinent labs & imaging results that were available during my care of the patient were reviewed by me and considered in my medical decision  making (see chart for details).     Allergic reaction to wasp bites. Non toxic in initial arrival without compromised airway. Solumedrol, pepcid, benadryl provided. Patient states already feels much improved. No longer with any chest pain, ekg reassuring. Areas of bites to arms still itching. 5 days of prednisone, pepcid, zyrtec and topical kenalog provided. Return precautions provided. Patient verbalized understanding and agreeable to plan. Ambulatory out of clinic without difficulty.    Final Clinical Impressions(s) / UC Diagnoses   Final diagnoses:  Allergic reaction, initial encounter  Accidental wasp sting     Discharge Instructions     Please continue with daily zyrtec, 5 days of prednisone, at least 5 days of Pepcid twice a day.  May apply kenalog to areas of bites to help with itching and swelling.  If you develop any increased shortness of breath, chest tightness, mouth or throat swelling or itching, difficulty breathing, or otherwise worsening, especially in the next 24 hours please return or go to the Er.     ED Prescriptions    Medication Sig Dispense Auth. Provider   predniSONE (DELTASONE) 20 MG tablet Take 2 tablets (40 mg total) by mouth daily with breakfast for 5 days. 10 tablet Linus Mako B, NP   famotidine (PEPCID) 20 MG tablet Take 1 tablet (20 mg total) by mouth 2 (two) times daily for 10 days. 20 tablet Linus Mako B, NP   cetirizine (ZYRTEC) 5 MG tablet Take 1 tablet (5 mg total) by mouth daily for 10 days. 10 tablet Linus Mako B, NP    triamcinolone cream (KENALOG) 0.1 % Apply 1 application topically 2 (two) times daily. 30 g Georgetta Haber, NP     Controlled Substance Prescriptions [place] Controlled Substance Registry consulted? Not Applicable   Georgetta Haber, NP 05/12/18 1339

## 2018-05-12 NOTE — Discharge Instructions (Addendum)
Please continue with daily zyrtec, 5 days of prednisone, at least 5 days of Pepcid twice a day.  May apply kenalog to areas of bites to help with itching and swelling.  If you develop any increased shortness of breath, chest tightness, mouth or throat swelling or itching, difficulty breathing, or otherwise worsening, especially in the next 24 hours please return or go to the Er.

## 2018-10-01 ENCOUNTER — Other Ambulatory Visit: Payer: Medicare Other

## 2018-10-03 ENCOUNTER — Ambulatory Visit: Payer: Medicare Other | Admitting: Urology

## 2018-10-05 ENCOUNTER — Ambulatory Visit: Payer: Medicare Other | Admitting: Urology

## 2018-12-12 ENCOUNTER — Inpatient Hospital Stay
Admission: EM | Admit: 2018-12-12 | Discharge: 2018-12-14 | DRG: 193 | Disposition: A | Discharge status: Home / Self Care | Payer: Medicare Other | Attending: Internal Medicine | Admitting: Internal Medicine

## 2018-12-12 ENCOUNTER — Other Ambulatory Visit: Payer: Self-pay

## 2018-12-12 ENCOUNTER — Emergency Department: Payer: Medicare Other

## 2018-12-12 ENCOUNTER — Encounter: Payer: Self-pay | Admitting: Internal Medicine

## 2018-12-12 ENCOUNTER — Inpatient Hospital Stay: Payer: Medicare Other

## 2018-12-12 DIAGNOSIS — Z23 Encounter for immunization: Secondary | ICD-10-CM | POA: Diagnosis present

## 2018-12-12 DIAGNOSIS — J440 Chronic obstructive pulmonary disease with acute lower respiratory infection: Secondary | ICD-10-CM

## 2018-12-12 DIAGNOSIS — R64 Cachexia: Secondary | ICD-10-CM | POA: Diagnosis present

## 2018-12-12 DIAGNOSIS — J189 Pneumonia, unspecified organism: Secondary | ICD-10-CM | POA: Diagnosis present

## 2018-12-12 DIAGNOSIS — R972 Elevated prostate specific antigen [PSA]: Secondary | ICD-10-CM | POA: Diagnosis present

## 2018-12-12 DIAGNOSIS — I739 Peripheral vascular disease, unspecified: Secondary | ICD-10-CM | POA: Diagnosis present

## 2018-12-12 DIAGNOSIS — F329 Major depressive disorder, single episode, unspecified: Secondary | ICD-10-CM | POA: Diagnosis present

## 2018-12-12 DIAGNOSIS — E43 Unspecified severe protein-calorie malnutrition: Secondary | ICD-10-CM | POA: Diagnosis present

## 2018-12-12 DIAGNOSIS — J1100 Influenza due to unidentified influenza virus with unspecified type of pneumonia: Principal | ICD-10-CM | POA: Diagnosis present

## 2018-12-12 DIAGNOSIS — I10 Essential (primary) hypertension: Secondary | ICD-10-CM | POA: Diagnosis present

## 2018-12-12 DIAGNOSIS — N179 Acute kidney failure, unspecified: Secondary | ICD-10-CM

## 2018-12-12 DIAGNOSIS — F17210 Nicotine dependence, cigarettes, uncomplicated: Secondary | ICD-10-CM | POA: Diagnosis present

## 2018-12-12 DIAGNOSIS — Z7982 Long term (current) use of aspirin: Secondary | ICD-10-CM

## 2018-12-12 DIAGNOSIS — R0600 Dyspnea, unspecified: Secondary | ICD-10-CM

## 2018-12-12 DIAGNOSIS — J441 Chronic obstructive pulmonary disease with (acute) exacerbation: Secondary | ICD-10-CM | POA: Diagnosis present

## 2018-12-12 DIAGNOSIS — Z79899 Other long term (current) drug therapy: Secondary | ICD-10-CM

## 2018-12-12 DIAGNOSIS — H409 Unspecified glaucoma: Secondary | ICD-10-CM | POA: Diagnosis present

## 2018-12-12 DIAGNOSIS — Z823 Family history of stroke: Secondary | ICD-10-CM

## 2018-12-12 DIAGNOSIS — Z9109 Other allergy status, other than to drugs and biological substances: Secondary | ICD-10-CM

## 2018-12-12 DIAGNOSIS — Z681 Body mass index (BMI) 19 or less, adult: Secondary | ICD-10-CM | POA: Diagnosis not present

## 2018-12-12 DIAGNOSIS — R197 Diarrhea, unspecified: Secondary | ICD-10-CM

## 2018-12-12 DIAGNOSIS — Z9841 Cataract extraction status, right eye: Secondary | ICD-10-CM | POA: Diagnosis not present

## 2018-12-12 DIAGNOSIS — R634 Abnormal weight loss: Secondary | ICD-10-CM | POA: Diagnosis not present

## 2018-12-12 DIAGNOSIS — Z8249 Family history of ischemic heart disease and other diseases of the circulatory system: Secondary | ICD-10-CM

## 2018-12-12 HISTORY — DX: Tobacco use: Z72.0

## 2018-12-12 HISTORY — DX: Chronic obstructive pulmonary disease, unspecified: J44.9

## 2018-12-12 LAB — CBC
HEMATOCRIT: 43.8 % (ref 39.0–52.0)
Hemoglobin: 14.8 g/dL (ref 13.0–17.0)
MCH: 30.5 pg (ref 26.0–34.0)
MCHC: 33.8 g/dL (ref 30.0–36.0)
MCV: 90.1 fL (ref 80.0–100.0)
NRBC: 0 % (ref 0.0–0.2)
Platelets: 122 10*3/uL — ABNORMAL LOW (ref 150–400)
RBC: 4.86 MIL/uL (ref 4.22–5.81)
RDW: 13 % (ref 11.5–15.5)
WBC: 12.7 10*3/uL — ABNORMAL HIGH (ref 4.0–10.5)

## 2018-12-12 LAB — BASIC METABOLIC PANEL
Anion gap: 11 (ref 5–15)
BUN: 54 mg/dL — ABNORMAL HIGH (ref 8–23)
CHLORIDE: 97 mmol/L — AB (ref 98–111)
CO2: 24 mmol/L (ref 22–32)
CREATININE: 1.97 mg/dL — AB (ref 0.61–1.24)
Calcium: 8.4 mg/dL — ABNORMAL LOW (ref 8.9–10.3)
GFR calc non Af Amer: 33 mL/min — ABNORMAL LOW (ref >60)
GFR, EST AFRICAN AMERICAN: 38 mL/min — AB (ref >60)
Glucose, Bld: 134 mg/dL — ABNORMAL HIGH (ref 70–99)
Potassium: 3.7 mmol/L (ref 3.5–5.1)
Sodium: 132 mmol/L — ABNORMAL LOW (ref 135–145)

## 2018-12-12 LAB — LACTIC ACID, PLASMA: Lactic Acid, Venous: 1.3 mmol/L (ref 0.5–1.9)

## 2018-12-12 LAB — TROPONIN I: Troponin I: 0.03 ng/mL (ref <0.03)

## 2018-12-12 LAB — INFLUENZA PANEL BY PCR (TYPE A & B)
INFLBPCR: NEGATIVE
Influenza A By PCR: POSITIVE — AB

## 2018-12-12 MED ORDER — SODIUM CHLORIDE 0.9 % IV SOLN
500.0000 mg | Freq: Once | INTRAVENOUS | Status: AC
  Administered 2018-12-12: 500 mg via INTRAVENOUS
  Filled 2018-12-12: qty 500

## 2018-12-12 MED ORDER — MOMETASONE FURO-FORMOTEROL FUM 200-5 MCG/ACT IN AERO
2.0000 | INHALATION_SPRAY | Freq: Two times a day (BID) | RESPIRATORY_TRACT | Status: DC
  Administered 2018-12-12 – 2018-12-14 (×4): 2 via RESPIRATORY_TRACT
  Filled 2018-12-12: qty 8.8

## 2018-12-12 MED ORDER — ONDANSETRON HCL 4 MG PO TABS: 4.0000 mg | ORAL_TABLET | Freq: Four times a day (QID) | ORAL | Status: DC | PRN

## 2018-12-12 MED ORDER — SODIUM CHLORIDE 0.9 % IV SOLN
500.0000 mg | INTRAVENOUS | Status: DC
  Administered 2018-12-13: 500 mg via INTRAVENOUS
  Filled 2018-12-12 (×2): qty 500

## 2018-12-12 MED ORDER — SODIUM CHLORIDE 0.9 % IV BOLUS
1000.0000 mL | Freq: Once | INTRAVENOUS | Status: AC
  Administered 2018-12-12: 1000 mL via INTRAVENOUS

## 2018-12-12 MED ORDER — ASPIRIN EC 81 MG PO TBEC
81.0000 mg | DELAYED_RELEASE_TABLET | Freq: Every day | ORAL | Status: DC
  Administered 2018-12-12 – 2018-12-14 (×3): 81 mg via ORAL
  Filled 2018-12-12 (×3): qty 1

## 2018-12-12 MED ORDER — OSELTAMIVIR PHOSPHATE 30 MG PO CAPS
30.0000 mg | ORAL_CAPSULE | Freq: Every day | ORAL | Status: DC
  Administered 2018-12-12: 30 mg via ORAL
  Filled 2018-12-12 (×2): qty 1

## 2018-12-12 MED ORDER — SODIUM CHLORIDE 0.9 % IV SOLN
1.0000 g | INTRAVENOUS | Status: DC
  Administered 2018-12-13: 12:00:00 1 g via INTRAVENOUS
  Filled 2018-12-12 (×2): qty 10

## 2018-12-12 MED ORDER — METHYLPREDNISOLONE SODIUM SUCC 125 MG IJ SOLR
125.0000 mg | Freq: Once | INTRAMUSCULAR | Status: AC
  Administered 2018-12-12: 125 mg via INTRAVENOUS
  Filled 2018-12-12: qty 2

## 2018-12-12 MED ORDER — ENOXAPARIN SODIUM 30 MG/0.3ML ~~LOC~~ SOLN
30.0000 mg | SUBCUTANEOUS | Status: DC
  Administered 2018-12-12: 30 mg via SUBCUTANEOUS
  Filled 2018-12-12: qty 0.3

## 2018-12-12 MED ORDER — IPRATROPIUM-ALBUTEROL 0.5-2.5 (3) MG/3ML IN SOLN
3.0000 mL | RESPIRATORY_TRACT | Status: DC
  Administered 2018-12-12 – 2018-12-13 (×4): 3 mL via RESPIRATORY_TRACT
  Filled 2018-12-12 (×3): qty 3
  Filled 2018-12-12: qty 30

## 2018-12-12 MED ORDER — METHYLPREDNISOLONE SODIUM SUCC 125 MG IJ SOLR
60.0000 mg | Freq: Every day | INTRAMUSCULAR | Status: DC
  Administered 2018-12-13 – 2018-12-14 (×2): 60 mg via INTRAVENOUS
  Filled 2018-12-12 (×2): qty 2

## 2018-12-12 MED ORDER — SODIUM CHLORIDE 0.9 % IV SOLN
1.0000 g | Freq: Once | INTRAVENOUS | Status: AC
  Administered 2018-12-12: 1 g via INTRAVENOUS
  Filled 2018-12-12: qty 10

## 2018-12-12 MED ORDER — ALBUTEROL SULFATE (2.5 MG/3ML) 0.083% IN NEBU
5.0000 mg | INHALATION_SOLUTION | Freq: Once | RESPIRATORY_TRACT | Status: AC
  Administered 2018-12-12: 5 mg via RESPIRATORY_TRACT
  Filled 2018-12-12: qty 6

## 2018-12-12 MED ORDER — ONDANSETRON HCL 4 MG/2ML IJ SOLN: 4.0000 mg | Freq: Four times a day (QID) | INTRAMUSCULAR | Status: DC | PRN

## 2018-12-12 MED ORDER — IOPAMIDOL (ISOVUE-300) INJECTION 61%: 15.0000 mL | INTRAVENOUS | Status: AC

## 2018-12-12 MED ORDER — ACETAMINOPHEN 325 MG PO TABS: 650.0000 mg | ORAL_TABLET | Freq: Four times a day (QID) | ORAL | Status: DC | PRN

## 2018-12-12 MED ORDER — INFLUENZA VAC SPLIT HIGH-DOSE 0.5 ML IM SUSY
0.5000 mL | PREFILLED_SYRINGE | INTRAMUSCULAR | Status: AC
  Administered 2018-12-14: 12:00:00 0.5 mL via INTRAMUSCULAR
  Filled 2018-12-12 (×2): qty 0.5

## 2018-12-12 MED ORDER — NICOTINE 21 MG/24HR TD PT24
21.0000 mg | MEDICATED_PATCH | Freq: Every day | TRANSDERMAL | Status: DC
  Administered 2018-12-12 – 2018-12-14 (×3): 21 mg via TRANSDERMAL
  Filled 2018-12-12 (×3): qty 1

## 2018-12-12 MED ORDER — SODIUM CHLORIDE 0.9 % IV SOLN
INTRAVENOUS | Status: DC
  Administered 2018-12-12 – 2018-12-13 (×2): via INTRAVENOUS

## 2018-12-12 MED ORDER — ACETAMINOPHEN 650 MG RE SUPP: 650.0000 mg | Freq: Four times a day (QID) | RECTAL | Status: DC | PRN

## 2018-12-12 NOTE — ED Notes (Signed)
ED TO INPATIENT HANDOFF REPORT  Name/Age/Gender Christopher Macdonald 73 y.o. male  Code Status   Home/SNF/Other Home   Chief Complaint sob  Level of Care/Admitting Diagnosis ED Disposition    ED Disposition Condition Comment   Admit  Hospital Area: [facility] [[id number]]  Level of Care: Med-Surg [16]  Diagnosis: Pneumonia [227785]  Admitting Physician: Enid Baas [[id number]]  Attending Physician: Enid Baas [[id number]]  Estimated length of stay: past midnight tomorrow  Certification:: I certify this patient will need inpatient services for at least 2 midnights  PT Class (Do Not Modify): Inpatient [101]  PT Acc Code (Do Not Modify): Private [1]       Medical History Past Medical History:  Diagnosis Date  . Depression   . Elevated PSA   . Glaucoma   . Hepatitis   . HTN (hypertension)     Allergies Allergies  Allergen Reactions  . Wasp Venom Swelling    IV Location/Drains/Wounds Patient Lines/Drains/Airways Status   Active Line/Drains/Airways    Name:   Placement date:   Placement time:   Site:   Days:   Peripheral IV 07/03/17 Left Antecubital   07/03/17    1034    Antecubital   527   Peripheral IV 12/12/18 Left Antecubital   12/12/18    1115    Antecubital   less than 1          Labs/Imaging Results for orders placed or performed during the hospital encounter of 12/12/18 (from the past 48 hour(s))  Basic metabolic panel     Status: Abnormal   Collection Time: 12/12/18  9:54 AM  Result Value Ref Range   Sodium 132 (L) 135 - 145 mmol/L   Potassium 3.7 3.5 - 5.1 mmol/L   Chloride 97 (L) 98 - 111 mmol/L   CO2 24 22 - 32 mmol/L   Glucose, Bld 134 (H) 70 - 99 mg/dL   BUN 54 (H) 8 - 23 mg/dL   Creatinine, Ser 4.16 (H) 0.61 - 1.24 mg/dL   Calcium 8.4 (L) 8.9 - 10.3 mg/dL   GFR calc non Af Amer 33 (L) >60 mL/min   GFR calc Af Amer 38 (L) >60 mL/min   Anion gap 11 5 - 15    Comment: Performed at [facility], [address] [place], [place] [address]  CBC     Status: Abnormal   Collection Time: 12/12/18  9:54 AM  Result Value Ref Range   WBC 12.7 (H) 4.0 - 10.5 K/uL   RBC 4.86 4.22 - 5.81 MIL/uL   Hemoglobin 14.8 13.0 - 17.0 g/dL   HCT 16.0 10.9 - 32.3 %   MCV 90.1 80.0 - 100.0 fL   MCH 30.5 26.0 - 34.0 pg   MCHC 33.8 30.0 - 36.0 g/dL   RDW 55.7 32.2 - 02.5 %   Platelets 122 (L) 150 - 400 K/uL   nRBC 0.0 0.0 - 0.2 %    Comment: Performed at [facility], [address] [place], [place] [address]  Troponin I - ONCE - STAT     Status: Abnormal   Collection Time: 12/12/18  9:54 AM  Result Value Ref Range   Troponin I 0.03 (HH) <0.03 ng/mL    Comment: CRITICAL RESULT CALLED TO, READ BACK BY AND VERIFIED WITH TIFFANY [email or web address]  ON 12/12/18 BY HKP Performed at [facility], [address] [place], [place] [address]    Dg Chest 2 View  Result Date: 12/12/2018 CLINICAL  DATA:  Shortness of breath for the past 2-3 days. EXAM: CHEST - 2 VIEW COMPARISON:  Chest x-ray dated October 20, 2017. FINDINGS: The heart size and mediastinal contours are within normal limits. Normal pulmonary vascularity. The lungs remain hyperinflated with emphysematous changes. New patchy infiltrate in the right lower lobe. No pleural effusion or pneumothorax. No acute osseous abnormality. Old right-sided rib fractures again noted. IMPRESSION: 1. Right lower lobe pneumonia. 2. COPD. Electronically Signed   By: Obie Dredge M.D.   On: 12/12/2018 10:23    Pending Labs Unresulted Labs (From admission, onward)    Start     Ordered   12/12/18 1129  Lactic acid, plasma  Now then every 2 hours,   STAT     12/12/18 1128   12/12/18 1122  Culture, blood (routine x 2)  BLOOD CULTURE X 2,   STAT     12/12/18 1123   12/12/18 1111  Influenza panel by PCR (type A & B)  (Influenza PCR Panel)  Once,   STAT     12/12/18 1123   Signed and Held  Basic metabolic panel  Tomorrow morning,   R     Signed and Held    Signed and Held  CBC  Tomorrow morning,   R     Signed and Held          Vitals/Pain Today's Vitals   12/12/18 0953 12/12/18 1111 12/12/18 1145 12/12/18 1200  BP: 115/70   (!) 113/94  Pulse: 82  72 72  Resp: (!) 24  (!) 24 18  SpO2: 93%  96% 96%  Weight:  63 kg    Height:  5\' 10"  (1.778 m)    PainSc:        Isolation Precautions No active isolations  Medications Medications  cefTRIAXone (ROCEPHIN) 1 g in sodium chloride 0.9 % 100 mL IVPB (1 g Intravenous New Bag/Given 12/12/18 1150)  azithromycin (ZITHROMAX) 500 mg in sodium chloride 0.9 % 250 mL IVPB (has no administration in time range)  sodium chloride 0.9 % bolus 1,000 mL (1,000 mLs Intravenous Bolus 12/12/18 1151)  methylPREDNISolone sodium succinate (SOLU-MEDROL) 125 mg/2 mL injection 60 mg (60 mg Intravenous Not Given 12/12/18 1202)  iopamidol (ISOVUE-300) 61 % injection 15 mL (has no administration in time range)  methylPREDNISolone sodium succinate (SOLU-MEDROL) 125 mg/2 mL injection 125 mg (125 mg Intravenous Given 12/12/18 1145)  albuterol (PROVENTIL) (2.5 MG/3ML) 0.083% nebulizer solution 5 mg (5 mg Nebulization Given 12/12/18 1146)    Mobility unknown

## 2018-12-12 NOTE — ED Notes (Signed)
Admitting MD at bedside.

## 2018-12-12 NOTE — Progress Notes (Signed)
Anticoagulation monitoring(Lovenox):  73 yo male ordered Lovenox 40 mg Q24h  Filed Weights   12/12/18 1111 12/12/18 1242  Weight: 138 lb 14.2 oz (63 kg) 110 lb 11.2 oz (50.2 kg)   BMI    Lab Results  Component Value Date   CREATININE 1.97 (H) 12/12/2018   Estimated Creatinine Clearance: 24.1 mL/min (A) (by C-G formula based on SCr of 1.97 mg/dL (H)). Hemoglobin & Hematocrit     Component Value Date/Time   HGB 14.8 12/12/2018 0954   HCT 43.8 12/12/2018 0954     Per Protocol for Patient with estCrcl < 30 ml/min and BMI < 40, will transition to Lovenox 30 mg Q24h.

## 2018-12-12 NOTE — H&P (Addendum)
[facility] - [place] [facility]   PATIENT NAME: Christopher RiffleJohn Macdonald    MR#:  [id number]  DATE OF BIRTH:  December 18, 1945  DATE OF ADMISSION:  12/12/2018  PRIMARY CARE PHYSICIAN: Rayetta HumphreyGeorge, Sionne A, MD   REQUESTING/REFERRING PHYSICIAN: Dr. Ileana RoupJames McShane  CHIEF COMPLAINT:   Chief Complaint  Patient presents with  . Shortness of Breath    HISTORY OF PRESENT ILLNESS:  Christopher RiffleJohn Christopher Macdonald  is a 73 y.o. male with a known history of ongoing smoking, COPD, glaucoma, hypertension came to the hospital secondary to worsening shortness of breath for 2 weeks.  Patient lives at home and has a roommate.  He is pretty independent at baseline.  He is very cachectic appearing.  He states he has been losing weight within the past year.  Continues to smoke cigarettes and also weight.  Breathing has been much worse within the last couple of weeks, initially started with exertion now even at rest he has some trouble breathing.  Has some dry cough.  Occasional loose stools which has been going on for years.  No fevers or chills.  Complains of weakness.  No sick contacts.  Noted to have COPD exacerbation on exam, also chest x-ray with light lower lobe pneumonia and elevated white count.  Labs with mild renal insufficiency  PAST MEDICAL HISTORY:   Past Medical History:  Diagnosis Date  . COPD (chronic obstructive pulmonary disease) (HCC)   . Depression   . Elevated PSA   . Glaucoma   . Hepatitis   . HTN (hypertension)   . Tobacco abuse     PAST SURGICAL HISTORY:   Past Surgical History:  Procedure Laterality Date  . cataract sugery Right   . GLAUCOMA SURGERY  2015  . HERNIA REPAIR  2014    SOCIAL HISTORY:   Social History   Tobacco Use  . Smoking status: Current Every Day Smoker    Packs/day: 1.00    Types: Cigarettes  . Smokeless tobacco: Former NeurosurgeonUser    Types: Chew  Substance Use Topics  . 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    FAMILY HISTORY:   Family History  Problem Relation Age of Onset  . CAD Mother   . Stroke Father   . Prostate cancer Neg Hx   . Kidney cancer Neg Hx   . Bladder Cancer Neg Hx     DRUG ALLERGIES:   Allergies  Allergen Reactions  . Wasp Venom Swelling    REVIEW OF SYSTEMS:   Review of Systems  Constitutional: Positive for malaise/fatigue. Negative for chills, fever and weight loss.  HENT: Positive for congestion. Negative for ear discharge, ear pain, hearing loss, nosebleeds and tinnitus.   Eyes: Negative for blurred vision, double vision and photophobia.  Respiratory: Positive for cough and shortness of breath. Negative for hemoptysis and wheezing.   Cardiovascular: Negative for chest pain, palpitations, orthopnea and leg swelling.  Gastrointestinal: Positive for diarrhea. Negative for abdominal pain, constipation, heartburn, melena, nausea and vomiting.  Genitourinary: Negative for dysuria, frequency, hematuria and urgency.  Musculoskeletal: Negative for back pain, myalgias and neck pain.  Skin: Negative for rash.  Neurological: Negative for dizziness, tingling, tremors, sensory change, speech change, focal weakness and headaches.  Endo/Heme/Allergies: Does not bruise/bleed easily.  Psychiatric/Behavioral: Negative for depression.    MEDICATIONS AT HOME:   Prior to Admission medications   Medication Sig Start Date End Date Taking? Authorizing Provider  albuterol (PROVENTIL HFA;VENTOLIN HFA) 108 (90 Base) MCG/ACT inhaler Inhale 2 puffs into the lungs 2 (two) times daily.  06/22/17 12/12/18 Yes [provider]  aspirin EC 81 MG tablet Take 81 mg by mouth daily.    Yes [provider]  brimonidine (ALPHAGAN) 0.2 % ophthalmic solution Place 1 drop into the right eye daily.  06/20/17  Yes [provider]  dorzolamide-timolol (COSOPT) 22.3-6.8 MG/ML ophthalmic solution Place 1 drop into the right eye daily.  11/12/18  Yes [provider]  gabapentin (NEURONTIN) 100 MG capsule Take 500 mg by mouth daily.  01/03/18  Yes [provider]  losartan (COZAAR) 50 MG tablet Take 50 mg by mouth daily.  06/29/17 12/12/18 Yes [provider]  LUMIGAN 0.01 % SOLN Place 1 drop into both eyes daily. 11/10/18  Yes [provider]  SPIRIVA HANDIHALER 18 MCG inhalation capsule Place 18 mcg into inhaler and inhale daily.  01/03/18  Yes [provider]  timolol (TIMOPTIC) 0.5 % ophthalmic solution  07/07/17   [provider]      VITAL SIGNS:  Blood pressure (!) 113/94, pulse 72, temperature (!) 97.4 F (36.3 C), temperature source Oral, resp. rate 18, height 5\' 10"  (1.778 m), weight 63 kg, SpO2 96 %.  PHYSICAL EXAMINATION:   Physical Exam  GENERAL:  73 y.o.-year-old thin built and ill nourished, patient lying in the bed with no acute distress.  Cachectic appearing EYES: Pupils equal, round, reactive to light and accommodation. No scleral icterus. Extraocular muscles intact.  HEENT: Head atraumatic, normocephalic. Oropharynx and nasopharynx clear.  NECK:  Supple, no jugular venous distention. No thyroid enlargement, no tenderness.  LUNGS: Scant breath sounds bilaterally with occasional scattered wheezing, no rales,rhonchi or crepitation. No use of accessory muscles of respiration.  CARDIOVASCULAR: S1, S2 normal. No murmurs, rubs, or gallops.  ABDOMEN: Soft, nontender, nondistended. Bowel sounds present. No organomegaly or mass.  EXTREMITIES: No pedal edema, cyanosis, or clubbing.  NEUROLOGIC: Cranial nerves II through XII are intact. Muscle strength 5/5 in all extremities. Sensation intact. Gait not checked.  PSYCHIATRIC: The patient is alert and oriented x 3.  SKIN: No obvious rash, lesion, or ulcer.   LABORATORY PANEL:   CBC Recent Labs  Lab 12/12/18 0954  WBC 12.7*  HGB 14.8  HCT 43.8  PLT 122*    ------------------------------------------------------------------------------------------------------------------  Chemistries  Recent Labs  Lab 12/12/18 0954  NA 132*  K 3.7  CL 97*  CO2 24  GLUCOSE 134*  BUN 54*  CREATININE 1.97*  CALCIUM 8.4*   ------------------------------------------------------------------------------------------------------------------  Cardiac Enzymes Recent Labs  Lab 12/12/18 0954  TROPONINI 0.03*   ------------------------------------------------------------------------------------------------------------------  RADIOLOGY:  Dg Chest 2 View  Result Date: 12/12/2018 CLINICAL DATA:  Shortness of breath for the past 2-3 days. EXAM: CHEST - 2 VIEW COMPARISON:  Chest x-ray dated October 20, 2017. FINDINGS: The heart size and mediastinal contours are within normal limits. Normal pulmonary vascularity. The lungs remain hyperinflated with emphysematous changes. New patchy infiltrate in the right lower lobe. No pleural effusion or pneumothorax. No acute osseous abnormality. Old right-sided rib fractures again noted. IMPRESSION: 1. Right lower lobe pneumonia. 2. COPD. Electronically Signed   By: Obie Dredge M.D.   On: 12/12/2018 10:23    EKG:   Orders placed or performed during the hospital encounter of 12/12/18  . ED EKG  . ED EKG  . EKG 12-Lead  . EKG 12-Lead    IMPRESSION AND PLAN:   Christopher Macdonald  is a  73 y.o. male with a known history of ongoing smoking, COPD, glaucoma, hypertension came to the hospital secondary to worsening shortness of breath for 2 weeks.  1.  Community-acquired pneumonia-right lower lobe infiltrate on chest x-ray. -Follow blood cultures. -Started on Rocephin and azithromycin. - also Influenza positive- so started Tamiflu  2.  COPD with mild exacerbation-start IV steroids. -Nebs and inhalers. -Strongly counseled against smoking-started on a nicotine patch  3.  Acute renal insufficiency-prerenal causes secondary  to diarrhea and poor oral intake. -IV fluids and monitor  4.  Weight loss-already has a CT abdomen pelvis and CT chest ordered in the ED.  5.  DVT prophylaxis-Lovenox  6.  Hypertension-restart home medicines once they are verified.    All the records are reviewed and case discussed with ED provider. Management plans discussed with the patient, family and they are in agreement.  CODE STATUS: Full Code  TOTAL TIME TAKING CARE OF THIS PATIENT: 51 minutes.    Enid Baas M.D on 12/12/2018 at 12:26 PM  Between 7am to 6pm - Pager - [phone]  After 6pm go to [email or web address] - password [email or web address]  [facility]  Office  [phone]  CC: Primary care physician; Rayetta Humphrey, MD

## 2018-12-12 NOTE — ED Notes (Signed)
Lab called with critical result of troponin 0.03

## 2018-12-12 NOTE — ED Triage Notes (Signed)
PT c/o increased SOB xfew days with decreased appetite. PT hx of COPD. PT appears pale and weak. VSS

## 2018-12-12 NOTE — ED Notes (Signed)
Pt transported to room 130 

## 2018-12-12 NOTE — ED Provider Notes (Addendum)
[facility] Emergency Department Provider Note  ____________________________________________   I have reviewed the triage vital signs and the nursing notes. Where available I have reviewed prior notes and, if possible and indicated, outside hospital notes.    HISTORY  Chief Complaint Shortness of Breath    HPI Christopher Macdonald is a 73 y.o. male  w hx of tobacco abuse, peripheral vascular disease, hypertension states over the last week or so he has been getting progressively weaker, and more short of breath.  He gets to the point where he cannot really walk around well without having to stop.  Even after a few steps.  He has had a cough.  He has not had a fever.  He denies any vomiting.  He has had somewhat decreased p.o., he states he has had copious diarrhea for "months".  Patient is noted on initial exam to be cachectic, and I did ask him if he is lost weight, he states he was 150 pounds a few years ago and now is down to what he believes to be 120, his weight loss has been accelerating over the last couple months and he thinks he is lost 20 pounds in the last few months but is not sure.  He states he has had no abdominal pain.  He does continue to smoke tobacco.  No known history of cancer documented.  His last creatinine was normal but that was in 2018.  He is here because he is coughing, weak and short of breath.  He denies chest pain.  Past Medical History:  Diagnosis Date  . Depression   . Elevated PSA   . Glaucoma   . Hepatitis   . HTN (hypertension)     Patient Active Problem List   Diagnosis Date Noted  . Atherosclerosis of native arteries of extremity with intermittent claudication (HCC) 08/26/2017  . Leg pain 08/26/2017  . COPD (chronic obstructive pulmonary disease) (HCC) 08/26/2017  . Essential hypertension 08/26/2017    Past Surgical History:  Procedure Laterality Date  . cataract sugery Right   . GLAUCOMA SURGERY  2015  . HERNIA REPAIR   2014    Prior to Admission medications   Medication Sig Start Date End Date Taking? Authorizing Provider  albuterol (PROVENTIL HFA;VENTOLIN HFA) 108 (90 Base) MCG/ACT inhaler Inhale into the lungs. 06/22/17 06/22/18  [provider]  aspirin EC 81 MG tablet Take by mouth.    [provider]  brimonidine (ALPHAGAN) 0.2 % ophthalmic solution  06/20/17   [provider]  cetirizine (ZYRTEC) 5 MG tablet Take 1 tablet (5 mg total) by mouth daily for 10 days. 05/12/18 05/22/18  Georgetta Haber, NP  dorzolamide (TRUSOPT) 2 % ophthalmic solution  06/20/17   [provider]  famotidine (PEPCID) 20 MG tablet Take 1 tablet (20 mg total) by mouth 2 (two) times daily for 10 days. 05/12/18 05/22/18  Georgetta Haber, NP  gabapentin (NEURONTIN) 300 MG capsule  01/03/18   [provider]  ibuprofen (ADVIL,MOTRIN) 600 MG tablet as needed.  04/16/14   [provider]  latanoprost (XALATAN) 0.005 % ophthalmic solution  05/03/17   [provider]  losartan (COZAAR) 50 MG tablet Take by mouth. 06/29/17 06/29/18  [provider]  nortriptyline (PAMELOR) 10 MG capsule  03/08/18   [provider]  SPIRIVA HANDIHALER 18 MCG inhalation capsule  01/03/18   [provider]  SUMAtriptan (IMITREX) 50 MG tablet Take 50 mg by mouth as needed for  migraine. May repeat in 2 hours if headache persists or recurs.    [provider]  tamsulosin (FLOMAX) 0.4 MG CAPS capsule Take 1 capsule (0.4 mg total) by mouth daily. 07/21/17   Vanna Scotland, MD  timolol (TIMOPTIC) 0.5 % ophthalmic solution  07/07/17   [provider]  triamcinolone cream (KENALOG) 0.1 % Apply 1 application topically 2 (two) times daily. 05/12/18   Georgetta Haber, NP    Allergies Wasp venom  Family History  Problem Relation Age of Onset  . Prostate cancer Neg Hx   . Kidney cancer Neg Hx   . Bladder Cancer Neg Hx     Social History Social History   Tobacco Use  .  Smoking status: Current Every Day Smoker    Packs/day: 1.00    Types: Cigarettes  . Smokeless tobacco: Former Neurosurgeon    Types: Chew  Substance Use Topics  . Alcohol use: No  . Drug use: No    Review of Systems Constitutional: No fever/chills Eyes: No visual changes. ENT: No sore throat. No stiff neck no neck pain Cardiovascular: Denies chest pain. Respiratory: + shortness of breath. Gastrointestinal:   no vomiting.  + Chronic diarrhea.  No constipation. Genitourinary: Negative for dysuria. Musculoskeletal: Negative lower extremity swelling Skin: Negative for rash. Neurological: Negative for severe headaches, focal weakness or numbness.   ____________________________________________   PHYSICAL EXAM:  VITAL SIGNS: ED Triage Vitals  Enc Vitals Group     BP 12/12/18 0953 115/70     Pulse Rate 12/12/18 0953 82     Resp 12/12/18 0953 (!) 24     Temp --      Temp src --      SpO2 12/12/18 0953 93 %     Weight 12/12/18 1111 138 lb 14.2 oz (63 kg)     Height 12/12/18 1111 5\' 10"  (1.778 m)     Head Circumference --      Peak Flow --      Pain Score 12/12/18 0947 0     Pain Loc --      Pain Edu? --      Excl. in GC? --     Constitutional: Alert and oriented.  Hectic gentleman who appears to be somewhat weak but not in any acute medical distress at this moment Eyes: Conjunctivae are normal Head: Atraumatic HEENT: No congestion/rhinnorhea. Mucous membranes are moist.  Oropharynx non-erythematous Neck:   Nontender with no meningismus, no masses, no stridor Cardiovascular: Normal rate, regular rhythm. Grossly normal heart sounds.  Good peripheral circulation. Respiratory: Slightly increased work of breathing, patient is diminished in the bases right greater than left, is moving air however.  Not appreciate rales or rhonchi Abdominal: Soft and nontender. No distention. No guarding no rebound Back:  There is no focal tenderness or step off.  there is no midline tenderness there  are no lesions noted. there is no CVA tenderness Musculoskeletal: No lower extremity tenderness, no upper extremity tenderness. No joint effusions, no DVT signs strong distal pulses no edema Neurologic:  Normal speech and language. No gross focal neurologic deficits are appreciated.  Skin:  Skin is warm, dry and intact. No rash noted. Psychiatric: Mood and affect are normal. Speech and behavior are normal.  ____________________________________________   LABS (all labs ordered are listed, but only abnormal results are displayed)  Labs Reviewed  BASIC METABOLIC PANEL - Abnormal; Notable for the following components:      Result Value   Sodium 132 (*)  Chloride 97 (*)    Glucose, Bld 134 (*)    BUN 54 (*)    Creatinine, Ser 1.97 (*)    Calcium 8.4 (*)    GFR calc non Af Amer 33 (*)    GFR calc Af Amer 38 (*)    All other components within normal limits  CBC - Abnormal; Notable for the following components:   WBC 12.7 (*)    Platelets 122 (*)    All other components within normal limits  TROPONIN I - Abnormal; Notable for the following components:   Troponin I 0.03 (*)    All other components within normal limits  CULTURE, BLOOD (ROUTINE X 2)  CULTURE, BLOOD (ROUTINE X 2)  INFLUENZA PANEL BY PCR (TYPE A & B)    Pertinent labs  results that were available during my care of the patient were reviewed by me and considered in my medical decision making (see chart for details). ____________________________________________  EKG  I personally interpreted any EKGs ordered by me or triage Sinus rhythm rate 83, no acute ST elevation or depression nonspecific ST changes normal axis ____________________________________________  RADIOLOGY  Pertinent labs & imaging results that were available during my care of the patient were reviewed by me and considered in my medical decision making (see chart for details). If possible, patient and/or family made aware of any abnormal findings.  Dg  Chest 2 View  Result Date: 12/12/2018 CLINICAL DATA:  Shortness of breath for the past 2-3 days. EXAM: CHEST - 2 VIEW COMPARISON:  Chest x-ray dated October 20, 2017. FINDINGS: The heart size and mediastinal contours are within normal limits. Normal pulmonary vascularity. The lungs remain hyperinflated with emphysematous changes. New patchy infiltrate in the right lower lobe. No pleural effusion or pneumothorax. No acute osseous abnormality. Old right-sided rib fractures again noted. IMPRESSION: 1. Right lower lobe pneumonia. 2. COPD. Electronically Signed   By: Obie Dredge M.D.   On: 12/12/2018 10:23   ____________________________________________    PROCEDURES  Procedure(s) performed: None  Procedures  Critical Care performed: CRITICAL CARE Performed by: Jeanmarie Plant   Total critical care time: 38 minutes  Critical care time was exclusive of separately billable procedures and treating other patients.  Critical care was necessary to treat or prevent imminent or life-threatening deterioration.  Critical care was time spent personally by me on the following activities: development of treatment plan with patient and/or surrogate as well as nursing, discussions with consultants, evaluation of patient's response to treatment, examination of patient, obtaining history from patient or surrogate, ordering and performing treatments and interventions, ordering and review of laboratory studies, ordering and review of radiographic studies, pulse oximetry and re-evaluation of patient's condition.   ____________________________________________   INITIAL IMPRESSION / ASSESSMENT AND PLAN / ED COURSE  Pertinent labs & imaging results that were available during my care of the patient were reviewed by me and considered in my medical decision making (see chart for details).  Patient here with cough and shortness of breath which is getting worse over the last several days to the point where he  cannot walk around his house.  Several abnormalities are noted.  First he has a pneumonia, and is not moving air well, we are giving him steroids, albuterol, Rocephin, Zithromax, and for this reason alone I think you require admission.  Influenza is being ordered, in addition patient's creatinine is 1.97 which is a new injury over the last time it was checked.  Giving him IV fluids.  It would be presumed that this is an AK I given his diarrhea but is unclear.  Patient is no evidence of or bright red blood per rectum his hemoglobin is reassuring, however, he is quite cachectic and as a prelude to admission although not something for which I think he necessarily would need to be admitted for on its own, we will obtain a CT scan of chest abdomen pelvis to look for possible oncologic process which could be causing him to be cachectic and short of breath.  Given pneumonia, cough, and aggressive weakness, I have low suspicion that acute PE is occurring, patient is not having any pleuritic pain, and certainly has multiple other different very likely causes of his dyspnea.  Given his kidney function I cannot get a CT scan with contrast at this time    ____________________________________________   FINAL CLINICAL IMPRESSION(S) / ED DIAGNOSES  Final diagnoses:  None      This chart was dictated using voice recognition software.  Despite best efforts to proofread,  errors can occur which can change meaning.      Jeanmarie PlantMcShane, James A, MD 12/12/18 1145    Jeanmarie PlantMcShane, James A, MD 12/12/18 1145

## 2018-12-13 DIAGNOSIS — E43 Unspecified severe protein-calorie malnutrition: Secondary | ICD-10-CM

## 2018-12-13 LAB — CBC
HCT: 39.7 % (ref 39.0–52.0)
HEMOGLOBIN: 13.5 g/dL (ref 13.0–17.0)
MCH: 30.5 pg (ref 26.0–34.0)
MCHC: 34 g/dL (ref 30.0–36.0)
MCV: 89.6 fL (ref 80.0–100.0)
Platelets: 102 10*3/uL — ABNORMAL LOW (ref 150–400)
RBC: 4.43 MIL/uL (ref 4.22–5.81)
RDW: 13 % (ref 11.5–15.5)
WBC: 8.5 10*3/uL (ref 4.0–10.5)
nRBC: 0 % (ref 0.0–0.2)

## 2018-12-13 LAB — C DIFFICILE QUICK SCREEN W PCR REFLEX
C Diff antigen: NEGATIVE
C Diff interpretation: NOT DETECTED
C Diff toxin: NEGATIVE

## 2018-12-13 LAB — PROCALCITONIN: Procalcitonin: 0.29 ng/mL

## 2018-12-13 LAB — BASIC METABOLIC PANEL
ANION GAP: 9 (ref 5–15)
BUN: 39 mg/dL — ABNORMAL HIGH (ref 8–23)
CALCIUM: 8 mg/dL — AB (ref 8.9–10.3)
CO2: 23 mmol/L (ref 22–32)
Chloride: 106 mmol/L (ref 98–111)
Creatinine, Ser: 1.02 mg/dL (ref 0.61–1.24)
GFR calc Af Amer: >60 mL/min (ref >60)
GFR calc non Af Amer: >60 mL/min (ref >60)
Glucose, Bld: 160 mg/dL — ABNORMAL HIGH (ref 70–99)
Potassium: 3.2 mmol/L — ABNORMAL LOW (ref 3.5–5.1)
Sodium: 138 mmol/L (ref 135–145)

## 2018-12-13 MED ORDER — ADULT MULTIVITAMIN W/MINERALS CH
1.0000 | ORAL_TABLET | Freq: Every day | ORAL | Status: DC
  Administered 2018-12-14: 09:00:00 1 via ORAL
  Filled 2018-12-13: qty 1

## 2018-12-13 MED ORDER — BRIMONIDINE TARTRATE 0.2 % OP SOLN
1.0000 [drp] | Freq: Every day | OPHTHALMIC | Status: DC
  Administered 2018-12-13: 1 [drp] via OPHTHALMIC
  Filled 2018-12-13: qty 5

## 2018-12-13 MED ORDER — ENSURE ENLIVE PO LIQD
237.0000 mL | Freq: Three times a day (TID) | ORAL | Status: DC
  Administered 2018-12-13 – 2018-12-14 (×3): 237 mL via ORAL

## 2018-12-13 MED ORDER — ENOXAPARIN SODIUM 40 MG/0.4ML ~~LOC~~ SOLN
40.0000 mg | SUBCUTANEOUS | Status: DC
  Administered 2018-12-13: 40 mg via SUBCUTANEOUS
  Filled 2018-12-13: qty 0.4

## 2018-12-13 MED ORDER — POTASSIUM CHLORIDE CRYS ER 20 MEQ PO TBCR
40.0000 meq | EXTENDED_RELEASE_TABLET | Freq: Once | ORAL | Status: AC
  Administered 2018-12-13: 40 meq via ORAL
  Filled 2018-12-13: qty 2

## 2018-12-13 MED ORDER — LATANOPROST 0.005 % OP SOLN
1.0000 [drp] | Freq: Every day | OPHTHALMIC | Status: DC
  Administered 2018-12-13: 21:00:00 1 [drp] via OPHTHALMIC
  Filled 2018-12-13 (×2): qty 2.5

## 2018-12-13 MED ORDER — OSELTAMIVIR PHOSPHATE 30 MG PO CAPS
30.0000 mg | ORAL_CAPSULE | Freq: Two times a day (BID) | ORAL | Status: DC
  Administered 2018-12-13: 07:00:00 30 mg via ORAL
  Filled 2018-12-13 (×2): qty 1

## 2018-12-13 MED ORDER — IPRATROPIUM-ALBUTEROL 0.5-2.5 (3) MG/3ML IN SOLN
3.0000 mL | Freq: Four times a day (QID) | RESPIRATORY_TRACT | Status: DC
  Administered 2018-12-13 – 2018-12-14 (×3): 3 mL via RESPIRATORY_TRACT
  Filled 2018-12-13 (×4): qty 3

## 2018-12-13 MED ORDER — OSELTAMIVIR PHOSPHATE 30 MG PO CAPS
30.0000 mg | ORAL_CAPSULE | Freq: Two times a day (BID) | ORAL | Status: DC
  Administered 2018-12-13 – 2018-12-14 (×2): 30 mg via ORAL
  Filled 2018-12-13 (×3): qty 1

## 2018-12-13 NOTE — Care Management Note (Signed)
Case Management Note  Patient Details  Name: Christopher Macdonald MRN: [id number] Date of Birth: 08-09-1946  Subjective/Objective:                  Met with the patient to discuss DC plan and needs Patient lives with a room mate Patient still works Patient drives Independent at home No DME needs Patient sees Dr. Iona Beard PCP Patient has [place] and can afford medications Patient states that he has no needs at this time Provided the patient with my contact information for any needs that arise   Action/Plan: Provided the [facility] contact information for any needs   Expected Discharge Date:                  Expected Discharge Plan:     In-House Referral:     Discharge planning Services  CM Consult  Post Acute Care Choice:    Choice offered to:     DME Arranged:    DME Agency:     HH Arranged:    [place] Agency:     Status of Service:  Completed, signed off  If discussed at [employer] of Stay Meetings, dates discussed:    Additional Comments:  Su Hilt, RN 12/13/2018, 9:27 AM

## 2018-12-13 NOTE — Progress Notes (Addendum)
Tamiflu anti-viral renal dose adjustment.  CrCl has improved from 24.1 to 46.5 ml/min this am.  Will readjust tamiflu to 30 mg bid for 5 days starting at 0600 this am.  Lovenox will continue at 30 mg daily dose considering patient's BMI borders < 15 and hgb has gone from 14.8 >> 13.5. Will continue to monitor.  Thomasene Ripple, PharmD, BCPS Clinical Pharmacist 12/13/2018

## 2018-12-13 NOTE — Progress Notes (Signed)
Per respiratory assessment protocol respiratory tx frequency changed to every 6 hours.

## 2018-12-13 NOTE — Progress Notes (Signed)
Initial Nutrition Assessment  DOCUMENTATION CODES:   Severe malnutrition in context of chronic illness, Underweight  INTERVENTION:  Provide Ensure Enlive po TID, each supplement provides 350 kcal and 20 grams of protein. Patient prefers chocolate.  Provide daily MVI.  Encouraged adequate intake of calories and protein at meals. Discussed choosing calorie- and protein-dense foods in setting of early satiety/small meals.  Monitor magnesium, potassium, and phosphorus daily for at least 3 days, MD to replete as needed, as pt is at risk for refeeding syndrome given severe malnutrition.  NUTRITION DIAGNOSIS:   Severe Malnutrition related to chronic illness(COPD) as evidenced by severe fat depletion, severe muscle depletion.  GOAL:   Patient will meet greater than or equal to 90% of their needs  MONITOR:   PO intake, Supplement acceptance, Skin  REASON FOR ASSESSMENT:   Malnutrition Screening Tool    ASSESSMENT:   73 year old male with PMHx of depression, glaucoma, HTN, hepatitis, tobacco abuse, COPD admitted with PNA, mild exacerbation of COPD, acute renal insufficiency, weight loss.   Met with patient at bedside. He reports a decreased appetite for years now but it has been worse over the past 2 weeks (recently had the flu). He tries to eat 2-3 meals per day but they are very small now. He may have a sandwich or a burger. He used to drink chocolate Equate shakes but has stopped drinking them lately. He wants to start drinking these again. We also discussed a homemade ONS he can make to drink, as well.  UBW was 150 lbs (68.2 kg). He reports slow weight loss over many years. Patient thought he was around 130 lbs now. He did not realize he was down to 50.2 kg (110.7 lbs).  Medications reviewed and include: Solu-Medrol 60 mg daily IV, Tamiflu, azithromycin, ceftriaxone.  Labs reviewed: Potassium 3.2.  NUTRITION - FOCUSED PHYSICAL EXAM:    Most Recent Value  Orbital Region   Severe depletion  Upper Arm Region  Severe depletion  Thoracic and Lumbar Region  Severe depletion  Buccal Region  Severe depletion  Temple Region  Severe depletion  Clavicle Bone Region  Severe depletion  Clavicle and Acromion Bone Region  Severe depletion  Scapular Bone Region  Severe depletion  Dorsal Hand  Severe depletion  Patellar Region  Severe depletion  Anterior Thigh Region  Severe depletion  Posterior Calf Region  Severe depletion  Edema (RD Assessment)  None  Hair  Reviewed  Eyes  Reviewed  Mouth  Reviewed  Skin  Reviewed  Nails  Reviewed     Diet Order:   Diet Order            Diet regular Room service appropriate? Yes; Fluid consistency: Thin  Diet effective now             EDUCATION NEEDS:   Education needs have been addressed  Skin:  Skin Assessment: Reviewed RN Assessment  Last BM:  12/12/2018 - medium type 7  Height:   Ht Readings from Last 1 Encounters:  12/12/18 _0  (1.803 m)   Weight:   Wt Readings from Last 1 Encounters:  12/12/18 50.2 kg   Ideal Body Weight:  78.2 kg  BMI:  Body mass index is 15.44 kg/m.  Estimated Nutritional Needs:   Kcal:  1500-1760 (30-35 kcal/kg)  Protein:  75-85 grams (1.5-1.7 grams/kg)  Fluid:  1.5-1.7 L/day (1 mL/kcal)  Willey Blade, MS, RD, LDN Office: [phone] Pager: [phone] After Hours/Weekend Pager: [phone]

## 2018-12-13 NOTE — Progress Notes (Signed)
[facility] - [place] [facility]                                                                                                                                                                                  Patient Demographics   Christopher Macdonald, is a 73 y.o. male, DOB - [birth year], BJY:[id number]  Admit date - 12/12/2018   Admitting Physician Enid Baasadhika Kalisetti, MD  Outpatient Primary MD for the patient is Rayetta HumphreyGeorge, Sionne A, MD   LOS - 1  Subjective:  Pt  Feeling little better, sob improved   Review of Systems:   CONSTITUTIONAL: No documented fever. No fatigue, weakness. No weight gain, no weight loss.  EYES: No blurry or double vision.  ENT: No tinnitus. No postnasal drip. No redness of the oropharynx.  RESPIRATORY: No cough, no wheeze, no hemoptysis. + dyspnea.  CARDIOVASCULAR: No chest pain. No orthopnea. No palpitations. No syncope.  GASTROINTESTINAL: No nausea, no vomiting or diarrhea. No abdominal pain. No melena or hematochezia.  GENITOURINARY: No dysuria or hematuria.  ENDOCRINE: No polyuria or nocturia. No heat or cold intolerance.  HEMATOLOGY: No anemia. No bruising. No bleeding.  INTEGUMENTARY: No rashes. No lesions.  MUSCULOSKELETAL: No arthritis. No swelling. No gout.  NEUROLOGIC: No numbness, tingling, or ataxia. No seizure-type activity.  PSYCHIATRIC: No anxiety. No insomnia. No ADD.    Vitals:   Vitals:   12/12/18 2219 12/13/18 0436 12/13/18 0806 12/13/18 0811  BP:  114/60  108/62  Pulse:  73  75  Resp:  17  18  Temp: (!) 97.3 F (36.3 C) (!) 97.4 F (36.3 C)  97.7 F (36.5 C)  TempSrc: Oral Oral  Oral  SpO2:  95% 92% 97%  Weight:      Height:        Wt Readings from Last 3 Encounters:  12/12/18 50.2 kg  05/12/18 63.5 kg  03/13/18 63.5 kg     Intake/Output Summary (Last 24 hours) at 12/13/2018 1309 Last data filed at 12/13/2018 0458 Gross per 24 hour  Intake 798.85 ml  Output -  Net 798.85 ml    Physical Exam:    GENERAL: Pleasant-appearing in no apparent distress.  HEAD, EYES, EARS, NOSE AND THROAT: Atraumatic, normocephalic. Extraocular muscles are intact. Pupils equal and reactive to light. Sclerae anicteric. No conjunctival injection. No oro-pharyngeal erythema.  NECK: Supple. There is no jugular venous distention. No bruits, no lymphadenopathy, no thyromegaly.  HEART: Regular rate and rhythm,. No murmurs, no rubs, no clicks.  LUNGS:decreased breath sound  ABDOMEN: Soft, flat, nontender, nondistended. Has good bowel sounds. No hepatosplenomegaly appreciated.  EXTREMITIES: No evidence of any cyanosis, clubbing, or peripheral edema.  +2 pedal  and radial pulses bilaterally.  NEUROLOGIC: The patient is alert, awake, and oriented x3 with no focal motor or sensory deficits appreciated bilaterally.  SKIN: Moist and warm with no rashes appreciated.  Psych: Not anxious, depressed LN: No inguinal LN enlargement    Antibiotics   Anti-infectives (From admission, onward)   Start     Dose/Rate Route Frequency Ordered Stop   12/13/18 1800  oseltamivir (TAMIFLU) capsule 30 mg     30 mg Oral 2 times daily 12/13/18 1038 12/18/18 0559   12/13/18 1700  azithromycin (ZITHROMAX) 500 mg in sodium chloride 0.9 % 250 mL IVPB     500 mg 250 mL/hr over 60 Minutes Intravenous Every 24 hours 12/12/18 1652     12/13/18 1200  cefTRIAXone (ROCEPHIN) 1 g in sodium chloride 0.9 % 100 mL IVPB     1 g 200 mL/hr over 30 Minutes Intravenous Every 24 hours 12/12/18 1652     12/13/18 0600  oseltamivir (TAMIFLU) capsule 30 mg  Status:  Discontinued     30 mg Oral 2 times daily 12/13/18 0558 12/13/18 1038   12/12/18 1445  oseltamivir (TAMIFLU) capsule 30 mg  Status:  Discontinued     30 mg Oral Daily 12/12/18 1433 12/13/18 0558   12/12/18 1130  cefTRIAXone (ROCEPHIN) 1 g in sodium chloride 0.9 % 100 mL IVPB     1 g 200 mL/hr over 30 Minutes Intravenous  Once 12/12/18 1123 12/12/18 1222   12/12/18 1130  azithromycin (ZITHROMAX)  500 mg in sodium chloride 0.9 % 250 mL IVPB     500 mg 250 mL/hr over 60 Minutes Intravenous  Once 12/12/18 1123 12/12/18 1931      Medications   Scheduled Meds: . aspirin EC  81 mg Oral Daily  . brimonidine  1 drop Right Eye Q1200  . enoxaparin (LOVENOX) injection  40 mg Subcutaneous Q24H  . feeding supplement (ENSURE ENLIVE)  237 mL Oral TID BM  . Influenza vac split quadrivalent PF  0.5 mL Intramuscular Tomorrow-1000  . ipratropium-albuterol  3 mL Nebulization Q6H  . latanoprost  1 drop Both Eyes QHS  . methylPREDNISolone (SOLU-MEDROL) injection  60 mg Intravenous Daily  . mometasone-formoterol  2 puff Inhalation BID  . [START ON 12/14/2018] multivitamin with minerals  1 tablet Oral Daily  . nicotine  21 mg Transdermal Daily  . oseltamivir  30 mg Oral BID   Continuous Infusions: . azithromycin    . cefTRIAXone (ROCEPHIN)  IV 1 g (12/13/18 1206)   PRN Meds:.acetaminophen **OR** acetaminophen, ondansetron **OR** ondansetron (ZOFRAN) IV   Data Review:   Micro Results Recent Results (from the past 240 hour(s))  Culture, blood (routine x 2)     Status: None (Preliminary result)   Collection Time: 12/12/18 11:39 AM  Result Value Ref Range Status   Specimen Description BLOOD LEFT ANTECUBITAL  Final   Special Requests   Final    BOTTLES DRAWN AEROBIC AND ANAEROBIC Blood Culture results may not be optimal due to an excessive volume of blood received in culture bottles   Culture   Final    NO GROWTH < 24 HOURS Performed at [facility], [address] [place], [place] [address]    Report Status PENDING  Incomplete  Culture, blood (routine x 2)     Status: None (Preliminary result)   Collection Time: 12/12/18 11:39 AM  Result Value Ref Range Status   Specimen Description BLOOD RIGHT ANTECUBITAL  Final   Special Requests   Final  BOTTLES DRAWN AEROBIC AND ANAEROBIC Blood Culture adequate volume   Culture   Final    NO GROWTH < 24 HOURS Performed at [facility], [address] [place], [place] [address]    Report Status PENDING  Incomplete    Radiology Reports Ct Abdomen Pelvis Wo Contrast  Result Date: 12/12/2018 CLINICAL DATA:  Shortness of breath, weight loss. EXAM: CT CHEST, ABDOMEN AND PELVIS WITHOUT CONTRAST TECHNIQUE: Multidetector CT imaging of the chest, abdomen and pelvis was performed following the standard protocol without IV contrast. COMPARISON:  CT scan of November 03, 2017 and April 30, 2013. FINDINGS: CT CHEST FINDINGS Cardiovascular: Atherosclerosis of thoracic aorta is noted without aneurysm formation. Normal cardiac size. No pericardial effusion is noted. Coronary artery calcifications are noted. Mediastinum/Nodes: No enlarged mediastinal, hilar, or axillary lymph nodes. Thyroid gland, trachea, and esophagus demonstrate no significant findings. Lungs/Pleura: No pneumothorax or pleural effusion is noted. Emphysematous disease is noted in both lungs. Hyperexpansion of the lungs is noted consistent with chronic obstructive pulmonary disease. Right lower lobe airspace opacity is noted concerning for possible pneumonia. Bronchial thickening is noted in the right lower lobe concerning for inflammation. Possible mucous plugging or aspirated material may be present. Musculoskeletal: Old right rib fractures are noted. No acute osseous abnormality is noted. CT ABDOMEN PELVIS FINDINGS Hepatobiliary: No focal liver abnormality is seen. No gallstones, gallbladder wall thickening, or biliary dilatation. Pancreas: Unremarkable. No pancreatic ductal dilatation or surrounding inflammatory changes. Spleen: Normal in size without focal abnormality. Adrenals/Urinary Tract: Adrenal glands are unremarkable. Kidneys are normal, without renal calculi, focal lesion, or hydronephrosis. Bladder is unremarkable. Stomach/Bowel: Stomach is within normal limits. Appendix appears normal. No evidence of bowel wall thickening, distention, or inflammatory changes.  Vascular/Lymphatic: Aortic atherosclerosis. No enlarged abdominal or pelvic lymph nodes. Reproductive: Stable mild prostatic enlargement is noted. Other: No abdominal wall hernia or abnormality. No abdominopelvic ascites. Musculoskeletal: No acute or significant osseous findings. IMPRESSION: Right lower lobe airspace opacity is noted most consistent with pneumonia. Followup PA and lateral chest X-ray is recommended in 3-4 weeks following trial of antibiotic therapy to ensure resolution and exclude underlying malignancy. Significant bronchial thickening is seen in the right lower lobe concerning for bronchitis. There also appears to be material within the right lower lobe bronchi concerning for aspirated material or mucous plugging. Clinical correlation and possibly bronchoscopy is recommended. Hyperexpansion of the lungs is noted with associated if is evident disease, consistent with chronic obstructive pulmonary disease. Stable mild prostatic enlargement. No other significant abnormality seen in the abdomen or pelvis. Aortic Atherosclerosis (ICD10-I70.0) and Emphysema (ICD10-J43.9). Electronically Signed   By: Lupita Raider, M.D.   On: 12/12/2018 17:19   Dg Chest 2 View  Result Date: 12/12/2018 CLINICAL DATA:  Shortness of breath for the past 2-3 days. EXAM: CHEST - 2 VIEW COMPARISON:  Chest x-ray dated October 20, 2017. FINDINGS: The heart size and mediastinal contours are within normal limits. Normal pulmonary vascularity. The lungs remain hyperinflated with emphysematous changes. New patchy infiltrate in the right lower lobe. No pleural effusion or pneumothorax. No acute osseous abnormality. Old right-sided rib fractures again noted. IMPRESSION: 1. Right lower lobe pneumonia. 2. COPD. Electronically Signed   By: Obie Dredge M.D.   On: 12/12/2018 10:23   Ct Chest Wo Contrast  Result Date: 12/12/2018 CLINICAL DATA:  Shortness of breath, weight loss. EXAM: CT CHEST, ABDOMEN AND PELVIS WITHOUT CONTRAST  TECHNIQUE: Multidetector CT imaging of the chest, abdomen and pelvis was performed following the standard  protocol without IV contrast. COMPARISON:  CT scan of November 03, 2017 and April 30, 2013. FINDINGS: CT CHEST FINDINGS Cardiovascular: Atherosclerosis of thoracic aorta is noted without aneurysm formation. Normal cardiac size. No pericardial effusion is noted. Coronary artery calcifications are noted. Mediastinum/Nodes: No enlarged mediastinal, hilar, or axillary lymph nodes. Thyroid gland, trachea, and esophagus demonstrate no significant findings. Lungs/Pleura: No pneumothorax or pleural effusion is noted. Emphysematous disease is noted in both lungs. Hyperexpansion of the lungs is noted consistent with chronic obstructive pulmonary disease. Right lower lobe airspace opacity is noted concerning for possible pneumonia. Bronchial thickening is noted in the right lower lobe concerning for inflammation. Possible mucous plugging or aspirated material may be present. Musculoskeletal: Old right rib fractures are noted. No acute osseous abnormality is noted. CT ABDOMEN PELVIS FINDINGS Hepatobiliary: No focal liver abnormality is seen. No gallstones, gallbladder wall thickening, or biliary dilatation. Pancreas: Unremarkable. No pancreatic ductal dilatation or surrounding inflammatory changes. Spleen: Normal in size without focal abnormality. Adrenals/Urinary Tract: Adrenal glands are unremarkable. Kidneys are normal, without renal calculi, focal lesion, or hydronephrosis. Bladder is unremarkable. Stomach/Bowel: Stomach is within normal limits. Appendix appears normal. No evidence of bowel wall thickening, distention, or inflammatory changes. Vascular/Lymphatic: Aortic atherosclerosis. No enlarged abdominal or pelvic lymph nodes. Reproductive: Stable mild prostatic enlargement is noted. Other: No abdominal wall hernia or abnormality. No abdominopelvic ascites. Musculoskeletal: No acute or significant osseous findings.  IMPRESSION: Right lower lobe airspace opacity is noted most consistent with pneumonia. Followup PA and lateral chest X-ray is recommended in 3-4 weeks following trial of antibiotic therapy to ensure resolution and exclude underlying malignancy. Significant bronchial thickening is seen in the right lower lobe concerning for bronchitis. There also appears to be material within the right lower lobe bronchi concerning for aspirated material or mucous plugging. Clinical correlation and possibly bronchoscopy is recommended. Hyperexpansion of the lungs is noted with associated if is evident disease, consistent with chronic obstructive pulmonary disease. Stable mild prostatic enlargement. No other significant abnormality seen in the abdomen or pelvis. Aortic Atherosclerosis (ICD10-I70.0) and Emphysema (ICD10-J43.9). Electronically Signed   By: Lupita RaiderJames  Green Jr, M.D.   On: 12/12/2018 17:19     CBC Recent Labs  Lab 12/12/18 0954 12/13/18 0321  WBC 12.7* 8.5  HGB 14.8 13.5  HCT 43.8 39.7  PLT 122* 102*  MCV 90.1 89.6  MCH 30.5 30.5  MCHC 33.8 34.0  RDW 13.0 13.0    Chemistries  Recent Labs  Lab 12/12/18 0954 12/13/18 0321  NA 132* 138  K 3.7 3.2*  CL 97* 106  CO2 24 23  GLUCOSE 134* 160*  BUN 54* 39*  CREATININE 1.97* 1.02  CALCIUM 8.4* 8.0*   ------------------------------------------------------------------------------------------------------------------ estimated creatinine clearance is 46.5 mL/min (by C-G formula based on SCr of 1.02 mg/dL). ------------------------------------------------------------------------------------------------------------------ No results for input(s): HGBA1C in the last 72 hours. ------------------------------------------------------------------------------------------------------------------ No results for input(s): CHOL, HDL, LDLCALC, TRIG, CHOLHDL, LDLDIRECT in the last 72  hours. ------------------------------------------------------------------------------------------------------------------ No results for input(s): TSH, T4TOTAL, T3FREE, THYROIDAB in the last 72 hours.  Invalid input(s): FREET3 ------------------------------------------------------------------------------------------------------------------ No results for input(s): VITAMINB12, FOLATE, FERRITIN, TIBC, IRON, RETICCTPCT in the last 72 hours.  Coagulation profile No results for input(s): INR, PROTIME in the last 168 hours.  No results for input(s): DDIMER in the last 72 hours.  Cardiac Enzymes Recent Labs  Lab 12/12/18 0954  TROPONINI 0.03*   ------------------------------------------------------------------------------------------------------------------ Invalid input(s): POCBNP    Assessment & Plan   Christopher Macdonald  is a 73 y.o. male with a  known history of ongoing smoking, COPD, glaucoma, hypertension came to the hospital secondary to worsening shortness of breath for 2 weeks.  1.  Community-acquired pneumonia-right lower lobe infiltrate on chest x-ray. -Follow blood cultures. -continue Rocephin and azithromycin. - also Influenza positive- so started Tamiflu  2.  COPD with mild exacerbation-start IV steroids. - continue Nebs and inhalers. -Strongly counseled against smoking-started on a nicotine patch  3.  Acute renal insufficiency-prerenal causes secondary to diarrhea and poor oral intake. - continue IV fluids and monitor  4.  Weight loss-already has a CT abdomen pelvis and CT chest negative  5.  DVT prophylaxis-Lovenox  6.  Hypertension-restart home medicines once they are verified.      Code Status Orders  (From admission, onward)         Start     Ordered   12/12/18 1653  Full code  Continuous     12/12/18 1652        Code Status History    This patient has a current code status but no historical code status.           Consults    none DVT Prophylaxis  Lovenox   Lab Results  Component Value Date   PLT 102 (L) 12/13/2018     Time Spent in minutes   Greater than 50% of time spent in care coordination and counseling patient regarding the condition and plan of care.   Auburn Bilberry M.D on 12/13/2018 at 1:09 PM  Between 7am to 6pm - Pager - [phone]  After 6pm go to [email or web address] - Social research officer, government  [employer]   Office  [phone]

## 2018-12-13 NOTE — Progress Notes (Signed)
Advanced care plan.  Purpose of the Encounter: CODE STATUS  Parties in Attendance: Patient himself Patient's Decision Capacity: Intact  Subjective/Patient's story: Patient 73 year old with history of COPD, hypertension,  glaucoma and depression presented with shortness of breath   Objective/Medical story  I discussed with the patient regarding his desires for cardiac and pulmonary resuscitation  Goals of care determination:   Patient states that he would like to be intubated and resuscitated cardiac resuscitation if needed  CODE STATUS: Full code   Time spent discussing advanced care planning: 16 minutes

## 2018-12-13 NOTE — Progress Notes (Signed)
PHARMACIST - PHYSICIAN COMMUNICATION  CONCERNING:  Enoxaparin (Lovenox) for DVT Prophylaxis   RECOMMENDATION: Patient was prescribed enoxaprin 30mg  q24 hours for VTE prophylaxis for CrCl <21ml/min   Filed Weights   12/12/18 1111 12/12/18 1242  Weight: 138 lb 14.2 oz (63 kg) 110 lb 11.2 oz (50.2 kg)    Body mass index is 15.44 kg/m.  Estimated Creatinine Clearance: 46.5 mL/min (by C-G formula based on SCr of 1.02 mg/dL).  Based on [facility] policy patient is candidate for enoxaparin 40mg  every 24 hour dosing due to improved CrCl >70ml/min  DESCRIPTION: Pharmacy has adjusted enoxaparin dose per [facility] policy.  Patient is now receiving enoxaparin 40mg  every 24 hours.   Gardner Candle, PharmD, BCPS Clinical Pharmacist 12/13/2018 9:42 AM

## 2018-12-14 LAB — BASIC METABOLIC PANEL
ANION GAP: 8 (ref 5–15)
BUN: 32 mg/dL — ABNORMAL HIGH (ref 8–23)
CO2: 22 mmol/L (ref 22–32)
Calcium: 8.4 mg/dL — ABNORMAL LOW (ref 8.9–10.3)
Chloride: 110 mmol/L (ref 98–111)
Creatinine, Ser: 0.68 mg/dL (ref 0.61–1.24)
GFR calc Af Amer: >60 mL/min (ref >60)
GFR calc non Af Amer: >60 mL/min (ref >60)
Glucose, Bld: 156 mg/dL — ABNORMAL HIGH (ref 70–99)
Potassium: 3.2 mmol/L — ABNORMAL LOW (ref 3.5–5.1)
SODIUM: 140 mmol/L (ref 135–145)

## 2018-12-14 LAB — MAGNESIUM: Magnesium: 2 mg/dL (ref 1.7–2.4)

## 2018-12-14 LAB — PHOSPHORUS: Phosphorus: 1.1 mg/dL — ABNORMAL LOW (ref 2.5–4.6)

## 2018-12-14 MED ORDER — POTASSIUM CHLORIDE CRYS ER 20 MEQ PO TBCR: 40.0000 meq | EXTENDED_RELEASE_TABLET | Freq: Every day | ORAL | 0 refills | Status: DC

## 2018-12-14 MED ORDER — POTASSIUM CHLORIDE CRYS ER 20 MEQ PO TBCR: 40.0000 meq | EXTENDED_RELEASE_TABLET | Freq: Every day | ORAL | Status: DC

## 2018-12-14 MED ORDER — POTASSIUM CHLORIDE CRYS ER 20 MEQ PO TBCR
40.0000 meq | EXTENDED_RELEASE_TABLET | Freq: Once | ORAL | Status: AC
  Administered 2018-12-14: 40 meq via ORAL
  Filled 2018-12-14: qty 2

## 2018-12-14 MED ORDER — LEVOFLOXACIN 500 MG PO TABS: 500.0000 mg | ORAL_TABLET | Freq: Every day | ORAL | 0 refills | Status: AC

## 2018-12-14 MED ORDER — OSELTAMIVIR PHOSPHATE 30 MG PO CAPS: 30.0000 mg | ORAL_CAPSULE | Freq: Two times a day (BID) | ORAL | 0 refills | Status: AC

## 2018-12-14 NOTE — Plan of Care (Signed)
Scrips and instructions provided, pt will purchase OTC nicotine patches, F/U appt wi PCP given, IV site DCd, bleeding controlled.  He will leave hospital in car with his family, understanding of all instructions verbalized

## 2018-12-14 NOTE — Discharge Summary (Signed)
[facility] - [place] [facility]  Jawara H Colegrove, 73 y.o., DOB [birth year], MRN [id number]. Admission date: 12/12/2018 Discharge Date 12/14/2018 Primary MD Rayetta HumphreyGeorge, Sionne A, MD Admitting Physician Enid Baasadhika Kalisetti, MD  Admission Diagnosis  Weight loss [R63.4] Chronic obstructive pulmonary disease with acute lower respiratory infection (HCC) [J44.0] AKI (acute kidney injury) (HCC) [N17.9] Dyspnea, unspecified type [R06.00] Diarrhea, unspecified type [R19.7] Community acquired pneumonia, unspecified laterality [J18.9]  Discharge Diagnosis   Active Problems: Community-acquired pneumonia COPD with mild exacerbation Acute renal insufficiency Severe caloric protein malnutrition Hypertension   Hospital Course JohnPettingellis a72 y.o.malewith a known history of ongoing smoking, COPD, glaucoma, hypertension came to the hospital secondary to worsening shortness of breath for 2 weeks.  Patient was noted to have influenza as well as pneumonia he was treated with antibiotics and Tamiflu.  His symptoms are now resolved he is doing much better and is stable for discharge to home.            Consults  None  Significant Tests:  See full reports for all details     Ct Abdomen Pelvis Wo Contrast  Result Date: 12/12/2018 CLINICAL DATA:  Shortness of breath, weight loss. EXAM: CT CHEST, ABDOMEN AND PELVIS WITHOUT CONTRAST TECHNIQUE: Multidetector CT imaging of the chest, abdomen and pelvis was performed following the standard protocol without IV contrast. COMPARISON:  CT scan of November 03, 2017 and April 30, 2013. FINDINGS: CT CHEST FINDINGS Cardiovascular: Atherosclerosis of thoracic aorta is noted without aneurysm formation. Normal cardiac size. No pericardial effusion is noted. Coronary artery calcifications are noted. Mediastinum/Nodes: No enlarged mediastinal, hilar, or axillary lymph nodes. Thyroid gland, trachea, and esophagus demonstrate no significant  findings. Lungs/Pleura: No pneumothorax or pleural effusion is noted. Emphysematous disease is noted in both lungs. Hyperexpansion of the lungs is noted consistent with chronic obstructive pulmonary disease. Right lower lobe airspace opacity is noted concerning for possible pneumonia. Bronchial thickening is noted in the right lower lobe concerning for inflammation. Possible mucous plugging or aspirated material may be present. Musculoskeletal: Old right rib fractures are noted. No acute osseous abnormality is noted. CT ABDOMEN PELVIS FINDINGS Hepatobiliary: No focal liver abnormality is seen. No gallstones, gallbladder wall thickening, or biliary dilatation. Pancreas: Unremarkable. No pancreatic ductal dilatation or surrounding inflammatory changes. Spleen: Normal in size without focal abnormality. Adrenals/Urinary Tract: Adrenal glands are unremarkable. Kidneys are normal, without renal calculi, focal lesion, or hydronephrosis. Bladder is unremarkable. Stomach/Bowel: Stomach is within normal limits. Appendix appears normal. No evidence of bowel wall thickening, distention, or inflammatory changes. Vascular/Lymphatic: Aortic atherosclerosis. No enlarged abdominal or pelvic lymph nodes. Reproductive: Stable mild prostatic enlargement is noted. Other: No abdominal wall hernia or abnormality. No abdominopelvic ascites. Musculoskeletal: No acute or significant osseous findings. IMPRESSION: Right lower lobe airspace opacity is noted most consistent with pneumonia. Followup PA and lateral chest X-ray is recommended in 3-4 weeks following trial of antibiotic therapy to ensure resolution and exclude underlying malignancy. Significant bronchial thickening is seen in the right lower lobe concerning for bronchitis. There also appears to be material within the right lower lobe bronchi concerning for aspirated material or mucous plugging. Clinical correlation and possibly bronchoscopy is recommended. Hyperexpansion of the lungs  is noted with associated if is evident disease, consistent with chronic obstructive pulmonary disease. Stable mild prostatic enlargement. No other significant abnormality seen in the abdomen or pelvis. Aortic Atherosclerosis (ICD10-I70.0) and Emphysema (ICD10-J43.9). Electronically Signed   By: Lupita RaiderJames  Green Jr, M.D.   On: 12/12/2018 17:19   Dg  Chest 2 View  Result Date: 12/12/2018 CLINICAL DATA:  Shortness of breath for the past 2-3 days. EXAM: CHEST - 2 VIEW COMPARISON:  Chest x-ray dated October 20, 2017. FINDINGS: The heart size and mediastinal contours are within normal limits. Normal pulmonary vascularity. The lungs remain hyperinflated with emphysematous changes. New patchy infiltrate in the right lower lobe. No pleural effusion or pneumothorax. No acute osseous abnormality. Old right-sided rib fractures again noted. IMPRESSION: 1. Right lower lobe pneumonia. 2. COPD. Electronically Signed   By: Obie Dredge M.D.   On: 12/12/2018 10:23   Ct Chest Wo Contrast  Result Date: 12/12/2018 CLINICAL DATA:  Shortness of breath, weight loss. EXAM: CT CHEST, ABDOMEN AND PELVIS WITHOUT CONTRAST TECHNIQUE: Multidetector CT imaging of the chest, abdomen and pelvis was performed following the standard protocol without IV contrast. COMPARISON:  CT scan of November 03, 2017 and April 30, 2013. FINDINGS: CT CHEST FINDINGS Cardiovascular: Atherosclerosis of thoracic aorta is noted without aneurysm formation. Normal cardiac size. No pericardial effusion is noted. Coronary artery calcifications are noted. Mediastinum/Nodes: No enlarged mediastinal, hilar, or axillary lymph nodes. Thyroid gland, trachea, and esophagus demonstrate no significant findings. Lungs/Pleura: No pneumothorax or pleural effusion is noted. Emphysematous disease is noted in both lungs. Hyperexpansion of the lungs is noted consistent with chronic obstructive pulmonary disease. Right lower lobe airspace opacity is noted concerning for possible  pneumonia. Bronchial thickening is noted in the right lower lobe concerning for inflammation. Possible mucous plugging or aspirated material may be present. Musculoskeletal: Old right rib fractures are noted. No acute osseous abnormality is noted. CT ABDOMEN PELVIS FINDINGS Hepatobiliary: No focal liver abnormality is seen. No gallstones, gallbladder wall thickening, or biliary dilatation. Pancreas: Unremarkable. No pancreatic ductal dilatation or surrounding inflammatory changes. Spleen: Normal in size without focal abnormality. Adrenals/Urinary Tract: Adrenal glands are unremarkable. Kidneys are normal, without renal calculi, focal lesion, or hydronephrosis. Bladder is unremarkable. Stomach/Bowel: Stomach is within normal limits. Appendix appears normal. No evidence of bowel wall thickening, distention, or inflammatory changes. Vascular/Lymphatic: Aortic atherosclerosis. No enlarged abdominal or pelvic lymph nodes. Reproductive: Stable mild prostatic enlargement is noted. Other: No abdominal wall hernia or abnormality. No abdominopelvic ascites. Musculoskeletal: No acute or significant osseous findings. IMPRESSION: Right lower lobe airspace opacity is noted most consistent with pneumonia. Followup PA and lateral chest X-ray is recommended in 3-4 weeks following trial of antibiotic therapy to ensure resolution and exclude underlying malignancy. Significant bronchial thickening is seen in the right lower lobe concerning for bronchitis. There also appears to be material within the right lower lobe bronchi concerning for aspirated material or mucous plugging. Clinical correlation and possibly bronchoscopy is recommended. Hyperexpansion of the lungs is noted with associated if is evident disease, consistent with chronic obstructive pulmonary disease. Stable mild prostatic enlargement. No other significant abnormality seen in the abdomen or pelvis. Aortic Atherosclerosis (ICD10-I70.0) and Emphysema (ICD10-J43.9).  Electronically Signed   By: Lupita Raider, M.D.   On: 12/12/2018 17:19       Today   Subjective:   Jonny Ruiz Mumme patient feeling much better stable to be discharged Objective:   Blood pressure 129/72, pulse 74, temperature 97.8 F (36.6 C), resp. rate 16, height 5\' 11"  (1.803 m), weight 50.2 kg, SpO2 94 %.  .  Intake/Output Summary (Last 24 hours) at 12/14/2018 1334 Last data filed at 12/14/2018 0800 Gross per 24 hour  Intake 350 ml  Output -  Net 350 ml    Exam VITAL SIGNS: Blood pressure 129/72, pulse 74,  temperature 97.8 F (36.6 C), resp. rate 16, height 5\' 11"  (1.803 m), weight 50.2 kg, SpO2 94 %.  GENERAL:  73 y.o.-year-old patient lying in the bed with no acute distress.  EYES: Pupils equal, round, reactive to light and accommodation. No scleral icterus. Extraocular muscles intact.  HEENT: Head atraumatic, normocephalic. Oropharynx and nasopharynx clear.  NECK:  Supple, no jugular venous distention. No thyroid enlargement, no tenderness.  LUNGS: Normal breath sounds bilaterally, no wheezing, rales,rhonchi or crepitation. No use of accessory muscles of respiration.  CARDIOVASCULAR: S1, S2 normal. No murmurs, rubs, or gallops.  ABDOMEN: Soft, nontender, nondistended. Bowel sounds present. No organomegaly or mass.  EXTREMITIES: No pedal edema, cyanosis, or clubbing.  NEUROLOGIC: Cranial nerves II through XII are intact. Muscle strength 5/5 in all extremities. Sensation intact. Gait not checked.  PSYCHIATRIC: The patient is alert and oriented x 3.  SKIN: No obvious rash, lesion, or ulcer.   Data Review     CBC w Diff:  Lab Results  Component Value Date   WBC 8.5 12/13/2018   HGB 13.5 12/13/2018   HCT 39.7 12/13/2018   PLT 102 (L) 12/13/2018   CMP:  Lab Results  Component Value Date   NA 140 12/14/2018   K 3.2 (L) 12/14/2018   CL 110 12/14/2018   CO2 22 12/14/2018   BUN 32 (H) 12/14/2018   CREATININE 0.68 12/14/2018  .  Micro Results Recent Results  (from the past 240 hour(s))  Culture, blood (routine x 2)     Status: None (Preliminary result)   Collection Time: 12/12/18 11:39 AM  Result Value Ref Range Status   Specimen Description BLOOD LEFT ANTECUBITAL  Final   Special Requests   Final    BOTTLES DRAWN AEROBIC AND ANAEROBIC Blood Culture results may not be optimal due to an excessive volume of blood received in culture bottles   Culture   Final    NO GROWTH 2 DAYS Performed at [facility], [address] [place], [place] [address]    Report Status PENDING  Incomplete  Culture, blood (routine x 2)     Status: None (Preliminary result)   Collection Time: 12/12/18 11:39 AM  Result Value Ref Range Status   Specimen Description BLOOD RIGHT ANTECUBITAL  Final   Special Requests   Final    BOTTLES DRAWN AEROBIC AND ANAEROBIC Blood Culture adequate volume   Culture   Final    NO GROWTH 2 DAYS Performed at [facility], [address] [place], [place] [address]    Report Status PENDING  Incomplete  C difficile quick scan w PCR reflex     Status: None   Collection Time: 12/13/18  7:11 PM  Result Value Ref Range Status   C Diff antigen NEGATIVE NEGATIVE Final   C Diff toxin NEGATIVE NEGATIVE Final   C Diff interpretation No C. difficile detected.  Final    Comment: Performed at [facility], [address] [place], [place] [address]        Code Status Orders  (From admission, onward)         Start     Ordered   12/12/18 1653  Full code  Continuous     12/12/18 1652        Code Status History    This patient has a current code status but no historical code status.          Follow-up Information    Rayetta HumphreyGeorge, Sionne A, MD. Go on  12/20/2018.   Specialty:  Family Medicine Why:  @1 :20 PM Contact information: [address] [place] [address] [phone]           Discharge Medications   Allergies as of 12/14/2018      Reactions   Wasp Venom Swelling      Medication  List    TAKE these medications   albuterol 108 (90 Base) MCG/ACT inhaler Commonly known as:  PROVENTIL HFA;VENTOLIN HFA Inhale 2 puffs into the lungs 2 (two) times daily.   aspirin EC 81 MG tablet Take 81 mg by mouth daily.   brimonidine 0.2 % ophthalmic solution Commonly known as:  ALPHAGAN Place 1 drop into the right eye daily.   dorzolamide-timolol 22.3-6.8 MG/ML ophthalmic solution Commonly known as:  COSOPT Place 1 drop into the right eye daily.   gabapentin 100 MG capsule Commonly known as:  NEURONTIN Take 500 mg by mouth daily.   levofloxacin 500 MG tablet Commonly known as:  LEVAQUIN Take 1 tablet (500 mg total) by mouth daily for 3 days.   losartan 50 MG tablet Commonly known as:  COZAAR Take 50 mg by mouth daily.   LUMIGAN 0.01 % Soln Generic drug:  bimatoprost Place 1 drop into both eyes daily.   oseltamivir 30 MG capsule Commonly known as:  TAMIFLU Take 1 capsule (30 mg total) by mouth 2 (two) times daily for 3 days.   potassium chloride SA 20 MEQ tablet Commonly known as:  K-DUR,KLOR-CON Take 2 tablets (40 mEq total) by mouth daily. Start taking on:  December 15, 2018   [facility] HANDIHALER 18 MCG inhalation capsule Generic drug:  tiotropium Place 18 mcg into inhaler and inhale daily.   timolol 0.5 % ophthalmic solution Commonly known as:  TIMOPTIC          Total Time in preparing paper work, data evaluation and todays exam - 35 minutes  Auburn Bilberry M.D on 12/14/2018 at 1:34 PM [facility]   Office  [phone]

## 2018-12-14 NOTE — Discharge Summary (Signed)
[facility] - [place] at [facility] Christopher Macdonald was admitted to the Hospital on 12/12/2018 and Discharged  12/14/2018 and should be excused from work/school   for  9  days starting 12/12/2018 , may return to work/school without any restrictions.  Call Auburn Bilberry MD with questions.  Auburn Bilberry M.D on 12/14/2018,at 1:27 PM  [facility] - [place] at [facility]  [phone]

## 2018-12-17 LAB — CULTURE, BLOOD (ROUTINE X 2)
Culture: NO GROWTH
Culture: NO GROWTH
SPECIAL REQUESTS: ADEQUATE

## 2019-01-24 ENCOUNTER — Other Ambulatory Visit: Payer: Self-pay | Admitting: Family Medicine

## 2019-01-24 ENCOUNTER — Other Ambulatory Visit: Payer: Self-pay

## 2019-01-24 ENCOUNTER — Ambulatory Visit
Admission: RE | Admit: 2019-01-24 | Discharge: 2019-01-24 | Disposition: A | Discharge status: Home / Self Care | Payer: Medicare Other | Source: Ambulatory Visit | Attending: Family Medicine | Admitting: Family Medicine

## 2019-01-24 ENCOUNTER — Ambulatory Visit
Admission: RE | Admit: 2019-01-24 | Discharge: 2019-01-24 | Disposition: A | Discharge status: Home / Self Care | Payer: Medicare Other | Attending: Family Medicine | Admitting: Family Medicine

## 2019-01-24 DIAGNOSIS — J189 Pneumonia, unspecified organism: Secondary | ICD-10-CM

## 2020-03-30 ENCOUNTER — Other Ambulatory Visit: Payer: Self-pay | Admitting: Family Medicine

## 2020-03-30 DIAGNOSIS — R7989 Other specified abnormal findings of blood chemistry: Secondary | ICD-10-CM

## 2020-04-07 ENCOUNTER — Ambulatory Visit: Payer: Medicare HMO | Attending: Family Medicine

## 2020-04-20 NOTE — Progress Notes (Incomplete)
04/21/20 7:51 PM   Parks [birth year] [id number]  Referring provider: Sharyne Peach, MD [place],  [place] [address] No chief complaint on file.   HPI: Christopher Macdonald is a 74 y.o. with history of elevated PSA who returns today for follow up.    Rectal exam 07/2017 was unremarkable, 45 g prostate without nodules.    He does have some baseline urinary symptoms.  He no longer has to get up and void at night, down from 2-5x since starting flomax last visit and treating UTI.    He is overall pleased with his voiding symptoms at this time.  He underwent a negative prostate bx in 02/2018.   No family history of prostate cancer.  No weight loss or bone pain.  PSA trend: 2.55 2014 4.47  06/2017 --> E. coli urinary tract infection 4.75 10/2017, infection cleared Repeat PSA today pending    PMH: Past Medical History:  Diagnosis Date  . COPD (chronic obstructive pulmonary disease) ([place])   . Depression   . Elevated PSA   . Glaucoma   . Hepatitis   . HTN (hypertension)   . Tobacco abuse     Surgical History: Past Surgical History:  Procedure Laterality Date  . cataract sugery Right   . GLAUCOMA SURGERY  2015  . HERNIA REPAIR  2014    Home Medications:  Allergies as of 04/21/2020      Reactions   Wasp Venom Swelling      Medication List       Accurate as of April 20, 2020  7:51 PM. If you have any questions, ask your nurse or doctor.        albuterol 108 (90 Base) MCG/ACT inhaler Commonly known as: VENTOLIN HFA Inhale 2 puffs into the lungs 2 (two) times daily.   aspirin EC 81 MG tablet Take 81 mg by mouth daily.   brimonidine 0.2 % ophthalmic solution Commonly known as: ALPHAGAN Place 1 drop into the right eye daily.   dorzolamide-timolol 22.3-6.8 MG/ML ophthalmic solution Commonly known as: COSOPT Place 1 drop into the right eye daily.   gabapentin 100 MG capsule Commonly known as: NEURONTIN Take 500 mg by mouth daily.   losartan 50 MG tablet Commonly known as: COZAAR Take 50 mg by mouth daily.   Lumigan 0.01 % Soln Generic drug: bimatoprost Place 1 drop into both eyes daily.   potassium chloride SA 20 MEQ tablet Commonly known as: KLOR-CON Take 2 tablets (40 mEq total) by mouth daily.   Spiriva HandiHaler 18 MCG inhalation capsule Generic drug: tiotropium Place 18 mcg into inhaler and inhale daily.   timolol 0.5 % ophthalmic solution Commonly known as: TIMOPTIC       Allergies:  Allergies  Allergen Reactions  . Wasp Venom Swelling    Family History: Family History  Problem Relation Age of Onset  . CAD Mother   . Stroke Father   . Prostate cancer Neg Hx   . Kidney cancer Neg Hx   . Bladder Cancer Neg Hx     Social History:  reports that he has been smoking cigarettes. He has been smoking about 1.00 pack per day. He has quit using smokeless tobacco.  His smokeless tobacco use included chew. He reports current alcohol use. He reports current drug use. Drug: Marijuana.   Physical Exam: There were no vitals taken for this visit.  Constitutional:  Alert and oriented, No acute distress. HEENT: [place] AT, moist mucus membranes.  Trachea midline, no masses. Cardiovascular: No clubbing, cyanosis, or edema. Respiratory: Normal respiratory effort, no increased work of breathing. GI: Abdomen is soft, nontender, nondistended, no abdominal masses GU: No CVA tenderness Lymph: No cervical or inguinal lymphadenopathy. Skin: No rashes, bruises or suspicious lesions. Neurologic: Grossly intact, no focal deficits, moving all 4 extremities. Psychiatric: Normal mood and affect.  Laboratory Data:   Urinalysis   Pertinent Imaging:   Assessment & Plan:     No follow-ups on file.  Christopher Macdonald [address], [address] [place], [place] [address] [phone]  I, Christopher Macdonald, am acting as a scribe for Dr. Vanna Macdonald,  {Add Scribe Attestation  Statement}

## 2020-04-21 ENCOUNTER — Ambulatory Visit: Payer: Medicare HMO | Admitting: Urology

## 2020-04-21 ENCOUNTER — Encounter: Payer: Self-pay | Admitting: Urology

## 2020-05-06 ENCOUNTER — Ambulatory Visit: Payer: Medicare HMO

## 2020-05-12 ENCOUNTER — Other Ambulatory Visit: Payer: Self-pay

## 2020-05-12 ENCOUNTER — Ambulatory Visit
Admission: RE | Admit: 2020-05-12 | Discharge: 2020-05-12 | Disposition: A | Discharge status: Home / Self Care | Payer: Medicare HMO | Source: Ambulatory Visit | Attending: Family Medicine | Admitting: Family Medicine

## 2020-05-12 DIAGNOSIS — R7989 Other specified abnormal findings of blood chemistry: Secondary | ICD-10-CM | POA: Diagnosis not present

## 2020-05-16 NOTE — Progress Notes (Signed)
05/19/2020 2:38 PM   Jadon Harbaugh Hopwood 04/04/1946 [id number]  Referring provider: Rayetta Humphrey, MD [address] [place],  [place] [address] Chief Complaint  Patient presents with   Elevated PSA    HPI: NICOLAI LABONTE is a 74 y.o. male with history of elevated PSA who is referred back for this purpose.  His last seen in 2019.  Since his last visit, he continues to lose weight and has been admitted with complications from COPD.  Pathology on 03/13/2018 showed no evidence of malignancy. There was benign prostate tissue with focal chronic inflammation.  TRUS volume ~50 g.  Patient was admitted to [facility] from 12/12/2018 to 12/14/2018. Patient discharged with acute renal insufficiency.   Most recent PSA on 03/24/2020 was 4.27.   RUQ abdominal ultrasound on 05/12/2020 revealed no definite abnormality seen in the right upper quadrant of the Abdomen.  He has trouble streaming at times with urinary hesitancy.  This comes and goes.  He is not currently on any BPH meds.  No history of urinary retention.  No dysuria or gross hematuria.  He has trouble with getting an erection. He has never tried Viagra. He has a stroke in the past.   Current smoker.  PSA trend: 2.55 2014 4.47  06/2017 --> E. coli urinary tract infection 4.75 10/2017, infection cleared 4.27 03/24/2020    PMH: Past Medical History:  Diagnosis Date   COPD (chronic obstructive pulmonary disease) (HCC)    Depression    Elevated PSA    Glaucoma    Hepatitis    HTN (hypertension)    Tobacco abuse     Surgical History: Past Surgical History:  Procedure Laterality Date   cataract sugery Right    GLAUCOMA SURGERY  2015   HERNIA REPAIR  2014    Home Medications:  Allergies as of 05/19/2020      Reactions   Wasp Venom Swelling      Medication List       Accurate as of May 19, 2020  2:38 PM. If you have any questions, ask your nurse or doctor.        albuterol 108 (90 Base) MCG/ACT  inhaler Commonly known as: VENTOLIN HFA Inhale 2 puffs into the lungs 2 (two) times daily.   aspirin EC 81 MG tablet Take 81 mg by mouth daily.   atorvastatin 40 MG tablet Commonly known as: LIPITOR Take 40 mg by mouth daily.   brimonidine 0.2 % ophthalmic solution Commonly known as: ALPHAGAN Place 1 drop into the right eye daily.   dorzolamide-timolol 22.3-6.8 MG/ML ophthalmic solution Commonly known as: COSOPT Place 1 drop into the right eye daily.   gabapentin 100 MG capsule Commonly known as: NEURONTIN Take 500 mg by mouth daily.   losartan 50 MG tablet Commonly known as: COZAAR Take 50 mg by mouth daily.   Lumigan 0.01 % Soln Generic drug: bimatoprost Place 1 drop into both eyes daily.   potassium chloride SA 20 MEQ tablet Commonly known as: KLOR-CON Take 2 tablets (40 mEq total) by mouth daily.   Spiriva HandiHaler 18 MCG inhalation capsule Generic drug: tiotropium Place 18 mcg into inhaler and inhale daily.   timolol 0.5 % ophthalmic solution Commonly known as: TIMOPTIC       Allergies:  Allergies  Allergen Reactions   Wasp Venom Swelling    Family History: Family History  Problem Relation Age of Onset   CAD Mother    Stroke Father    Prostate cancer  Neg Hx    Kidney cancer Neg Hx    Bladder Cancer Neg Hx     Social History:  reports that he has been smoking cigarettes. He has been smoking about 1.00 pack per day. He has quit using smokeless tobacco.  His smokeless tobacco use included chew. He reports current alcohol use. He reports current drug use. Drug: Marijuana.   Physical Exam: BP 136/83    Pulse 71    Ht 6' (1.829 m)    Wt 113 lb (51.3 kg)    BMI 15.33 kg/m   Constitutional:  Alert and oriented, No acute distress.  Cachectic. HEENT: [place] AT, moist mucus membranes.  Trachea midline, no masses. Temporal waisting Cardiovascular: No clubbing, cyanosis, or edema. Respiratory: Normal respiratory effort, no increased work of  breathing. GI: Abdomen is soft, nontender, nondistended, no abdominal masses GU: No CVA tenderness Rectal: Slightly decreased Sphincter tone, prostate, no nodules/tender Skin: No rashes, bruises or suspicious lesions. Neurologic: Grossly intact, no focal deficits, moving all 4 extremities. Psychiatric: Normal mood and affect.   Assessment & Plan:    1. Elevated PSA NED PSA elevated at 4.27.  Rectal exam today with prostamegaly but otherwise unremarkable doubt nodules Given that his PSA is essentially stable from 2019 at the time of biopsy, would not recommend any further work-up at this point in time especially in light of his multiple medical comorbidities We will plan to recheck in a year to ensure stability with PSA/DRE  2. Erectile dysfunction Previously did well on Viagra in the past. Recommended generic brand of Viagra. No contraindications Discussed optimal administration  3.  Urinary hesitancy Suboptimal operative candidate for outlet procedure given multiple medical issues including severe COPD Discussed option of pharmacotherapy, has been on Flomax in the past and would prefer to avoid this We will continue to follow clinically  Follow up in 1 year with PSA, DRE, IPSS   [facility] [address] [place], [place] [address] [phone]  I, Theador Hawthorne, am acting as a scribe for Dr. Vanna Scotland.  I have reviewed the above documentation for accuracy and completeness, and I agree with the above.   Vanna Scotland, MD  I spent 30 total minutes on the day of the encounter including pre-visit review of the medical record, face-to-face time with the patient, and post visit ordering of labs/imaging/tests.

## 2020-05-19 ENCOUNTER — Encounter: Payer: Self-pay | Admitting: Urology

## 2020-05-19 ENCOUNTER — Other Ambulatory Visit: Payer: Self-pay

## 2020-05-19 ENCOUNTER — Ambulatory Visit (INDEPENDENT_AMBULATORY_CARE_PROVIDER_SITE_OTHER): Payer: Medicare HMO | Admitting: Urology

## 2020-05-19 VITALS — BP 136/83 | HR 71 | Ht 72.0 in | Wt 113.0 lb

## 2020-05-19 DIAGNOSIS — R972 Elevated prostate specific antigen [PSA]: Secondary | ICD-10-CM

## 2020-05-19 DIAGNOSIS — R3911 Hesitancy of micturition: Secondary | ICD-10-CM | POA: Diagnosis not present

## 2020-05-19 DIAGNOSIS — N5203 Combined arterial insufficiency and corporo-venous occlusive erectile dysfunction: Secondary | ICD-10-CM

## 2020-05-19 MED ORDER — SILDENAFIL CITRATE 20 MG PO TABS: 20.0000 mg | ORAL_TABLET | ORAL | 11 refills | Status: DC | PRN

## 2020-05-28 ENCOUNTER — Telehealth: Payer: Self-pay | Admitting: *Deleted

## 2020-05-28 DIAGNOSIS — Z87891 Personal history of nicotine dependence: Secondary | ICD-10-CM

## 2020-05-28 DIAGNOSIS — Z122 Encounter for screening for malignant neoplasm of respiratory organs: Secondary | ICD-10-CM

## 2020-05-28 NOTE — Telephone Encounter (Signed)
Received referral for initial lung cancer screening scan. Contacted patient and obtained smoking history,(current, 63 pack year) as well as answering questions related to screening process. Patient denies signs of lung cancer such as weight loss or hemoptysis. Patient denies comorbidity that would prevent curative treatment if lung cancer were found. Patient is scheduled for shared decision making visit and CT scan on 06/09/20 at 2pm.

## 2020-06-09 ENCOUNTER — Other Ambulatory Visit: Payer: Self-pay

## 2020-06-09 ENCOUNTER — Encounter: Payer: Self-pay | Admitting: Nurse Practitioner

## 2020-06-09 ENCOUNTER — Ambulatory Visit
Admission: RE | Admit: 2020-06-09 | Discharge: 2020-06-09 | Disposition: A | Discharge status: Home / Self Care | Payer: Medicare HMO | Source: Ambulatory Visit | Attending: Nurse Practitioner | Admitting: Nurse Practitioner

## 2020-06-09 ENCOUNTER — Inpatient Hospital Stay: Payer: Medicare HMO | Attending: Nurse Practitioner | Admitting: Nurse Practitioner

## 2020-06-09 DIAGNOSIS — Z122 Encounter for screening for malignant neoplasm of respiratory organs: Secondary | ICD-10-CM

## 2020-06-09 DIAGNOSIS — Z87891 Personal history of nicotine dependence: Secondary | ICD-10-CM | POA: Diagnosis not present

## 2020-06-09 NOTE — Progress Notes (Signed)
Virtual Visit via Video Enabled Telemedicine Note   I connected with Christopher Macdonald on 06/09/20 at 1:30 PM EST by video enabled telemedicine visit and verified that I am speaking with the correct person using two identifiers.   I discussed the limitations, risks, security and privacy concerns of performing an evaluation and management service by telemedicine and the availability of in-person appointments. I also discussed with the patient that there may be a patient responsible charge related to this service. The patient expressed understanding and agreed to proceed.   Other persons participating in the visit and their role in the encounter: Shawn Perkins, RN- checking in patient & navigation  Patient's location: [facility]  Provider's location: Clinic  Chief Complaint: Low Dose CT Screening  Patient agreed to evaluation by telemedicine to discuss shared decision making for consideration of low dose CT lung cancer screening.    In accordance with CMS guidelines, patient has met eligibility criteria including age, absence of signs or symptoms of lung cancer.  Social History   Tobacco Use  . Smoking status: Current Every Day Smoker    Packs/day: 1.00    Years: 63.00    Pack years: 63.00    Types: Cigarettes  . Smokeless tobacco: Former User    Types: Chew  Substance Use Topics  . 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     A shared decision-making session was conducted prior to the performance of CT scan. This includes one or more decision aids, includes benefits and harms of screening, follow-up diagnostic testing, over-diagnosis, false positive rate, and total radiation exposure.   Counseling on the importance of adherence to annual lung cancer LDCT screening, impact of co-morbidities, and ability or willingness to undergo diagnosis and treatment is imperative for compliance of the program.   Counseling on the  importance of continued smoking cessation for former smokers; the importance of smoking cessation for current smokers, and information about tobacco cessation interventions have been given to patient including [place] Quit Smart and [employer] Quit Coker programs.   Written order for lung cancer screening with LDCT has been given to the patient and any and all questions have been answered to the best of my abilities.    Yearly follow up will be coordinated by Shawn Perkins, Thoracic Navigator.  I discussed the assessment and treatment plan with the patient. The patient was provided an opportunity to ask questions and all were answered. The patient agreed with the plan and demonstrated an understanding of the instructions.   The patient was advised to call back or seek an in-person evaluation if the symptoms worsen or if the condition fails to improve as anticipated.   I provided 15 minutes of face-to-face video visit time during this encounter, and > 50% was spent counseling as documented under my assessment & plan.   Lauren Allen, DNP, AGNP-C [facility] [phone] (clinic)  

## 2020-06-10 ENCOUNTER — Telehealth: Payer: Self-pay | Admitting: *Deleted

## 2020-06-10 NOTE — Telephone Encounter (Signed)
Notified patient of LDCT lung cancer screening program results with recommendation for 12 month follow up imaging. Also notified of incidental findings noted below and is encouraged to discuss further with PCP who will receive a copy of this note and/or the CT report. Patient verbalizes understanding.  

## 2020-12-07 ENCOUNTER — Encounter: Payer: Self-pay | Admitting: Emergency Medicine

## 2020-12-07 ENCOUNTER — Other Ambulatory Visit: Payer: Self-pay

## 2020-12-07 ENCOUNTER — Ambulatory Visit
Admission: EM | Admit: 2020-12-07 | Discharge: 2020-12-07 | Disposition: A | Discharge status: Home / Self Care | Payer: Medicare HMO | Attending: Family Medicine | Admitting: Family Medicine

## 2020-12-07 DIAGNOSIS — Z20822 Contact with and (suspected) exposure to covid-19: Secondary | ICD-10-CM | POA: Diagnosis not present

## 2020-12-07 DIAGNOSIS — Z79899 Other long term (current) drug therapy: Secondary | ICD-10-CM | POA: Insufficient documentation

## 2020-12-07 DIAGNOSIS — Z7982 Long term (current) use of aspirin: Secondary | ICD-10-CM | POA: Insufficient documentation

## 2020-12-07 DIAGNOSIS — R0602 Shortness of breath: Secondary | ICD-10-CM | POA: Insufficient documentation

## 2020-12-07 DIAGNOSIS — J069 Acute upper respiratory infection, unspecified: Secondary | ICD-10-CM | POA: Insufficient documentation

## 2020-12-07 DIAGNOSIS — I10 Essential (primary) hypertension: Secondary | ICD-10-CM | POA: Diagnosis not present

## 2020-12-07 DIAGNOSIS — F17210 Nicotine dependence, cigarettes, uncomplicated: Secondary | ICD-10-CM | POA: Insufficient documentation

## 2020-12-07 MED ORDER — PROMETHAZINE-DM 6.25-15 MG/5ML PO SYRP: 5.0000 mL | ORAL_SOLUTION | Freq: Four times a day (QID) | ORAL | 0 refills | Status: DC | PRN

## 2020-12-07 NOTE — ED Triage Notes (Signed)
Pt presents today with c/o of weakness, SOB, loss of appetite x 1 week.

## 2020-12-07 NOTE — Discharge Instructions (Addendum)
Isolate at home until the results of your Covid test are back.  If you test positive then you will have to quarantine for 5 days from your symptoms started.  After 5 days you can break quarantine if your symptoms have improved and you have not had a fever for 24 hours.  Use the Promethazine DM cough syrup every 6 hours as needed for cough and congestion.  This will make you drowsy so you may want to save it for bedtime.  Use your albuterol inhaler as needed for shortness of breath and wheezing.  If you develop worsening shortness of breath-especially at rest, you are unable to speak in full sentences, or is a late sign your lips start turning blue you need to go to the ER for evaluation.

## 2020-12-07 NOTE — ED Provider Notes (Signed)
[facility]    CSN: [id number] Arrival date & time: 12/07/20  1125      History   Chief Complaint Chief Complaint  Patient presents with  . Anorexia  . Weakness  . Shortness of Breath    HPI Christopher Macdonald is a 75 y.o. male.   HPI   75 year old male here for evaluation of weakness, shortness of breath, and decreased appetite that been going on for the past week.  Patient reports that he is also been dealing with a runny nose with nasal congestion, wheezing, and diarrhea that started this morning.  Patient also reports that he has had decreased sense of taste and smell.  Patient denies fever, sore throat, cough, nausea, or vomiting.  Patient has been vaccinated against Covid but has not received his booster yet, he has received his flu shot and pneumonia shot.  Patient is a smoker.  Patient's blood pressure was elevated on arrival at 155/119.  Patient reports that he did take his Cozaar this morning but that he had just smoked a cigarette before getting his blood pressure test.  Past Medical History:  Diagnosis Date  . COPD (chronic obstructive pulmonary disease) (HCC)   . Depression   . Elevated PSA   . Glaucoma   . Hepatitis   . HTN (hypertension)   . Tobacco abuse     Patient Active Problem List   Diagnosis Date Noted  . Protein-calorie malnutrition, severe 12/13/2018  . Pneumonia 12/12/2018  . Atherosclerosis of native arteries of extremity with intermittent claudication (HCC) 08/26/2017  . Leg pain 08/26/2017  . COPD (chronic obstructive pulmonary disease) (HCC) 08/26/2017  . Essential hypertension 08/26/2017    Past Surgical History:  Procedure Laterality Date  . cataract sugery Right   . GLAUCOMA SURGERY  2015  . HERNIA REPAIR  2014       Home Medications    Prior to Admission medications   Medication Sig Start Date End Date Taking? Authorizing Provider  albuterol (PROVENTIL HFA;VENTOLIN HFA) 108 (90 Base) MCG/ACT inhaler Inhale 2  puffs into the lungs 2 (two) times daily.  06/22/17 12/07/20 Yes [provider]  aspirin EC 81 MG tablet Take 81 mg by mouth daily.    Yes [provider]  brimonidine (ALPHAGAN) 0.2 % ophthalmic solution Place 1 drop into the right eye daily.  06/20/17  Yes [provider]  dorzolamide-timolol (COSOPT) 22.3-6.8 MG/ML ophthalmic solution Place 1 drop into the right eye daily.  11/12/18  Yes [provider]  losartan (COZAAR) 50 MG tablet Take 50 mg by mouth daily.  06/29/17 12/07/20 Yes [provider]  LUMIGAN 0.01 % SOLN Place 1 drop into both eyes daily. 11/10/18  Yes [provider]  promethazine-dextromethorphan (PROMETHAZINE-DM) 6.25-15 MG/5ML syrup Take 5 mLs by mouth 4 (four) times daily as needed. 12/07/20  Yes Becky Augusta, NP  timolol (TIMOPTIC) 0.5 % ophthalmic solution  07/07/17  Yes [provider]  sildenafil (REVATIO) 20 MG tablet Take 1 tablet (20 mg total) by mouth as needed. Take 1-5 tabs as needed prior to intercourse 05/19/20   Vanna Scotland, MD  [facility] HANDIHALER 18 MCG inhalation capsule Place 18 mcg into inhaler and inhale daily.  01/03/18   [provider]  atorvastatin (LIPITOR) 40 MG tablet Take 40 mg by mouth daily. 03/27/20 12/07/20  [provider]  gabapentin (NEURONTIN) 100 MG capsule Take 500 mg by mouth daily.  01/03/18 12/07/20  [provider]  potassium chloride SA (K-DUR,KLOR-CON)  20 MEQ tablet Take 2 tablets (40 mEq total) by mouth daily. 12/15/18 12/07/20  Auburn BilberryPatel, Shreyang, MD    Family History Family History  Problem Relation Age of Onset  . CAD Mother   . Stroke Father   . Prostate cancer Neg Hx   . Kidney cancer Neg Hx   . Bladder Cancer Neg Hx     Social History Social History   Tobacco Use  . Smoking status: Current Every Day Smoker    Packs/day: 1.00    Years: 63.00    Pack years: 63.00    Types: Cigarettes  . Smokeless tobacco: Former NeurosurgeonUser    Types: Careers information officerChew  Vaping  Use  . Vaping Use: Never used  Substance Use Topics  . 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  . Drug use: Yes    Types: Marijuana     Allergies   Wasp venom   Review of Systems Review of Systems  Constitutional: Positive for appetite change and fatigue. Negative for activity change and fever.  HENT: Positive for congestion and rhinorrhea. Negative for ear pain and sore throat.   Respiratory: Positive for shortness of breath and wheezing. Negative for cough.   Gastrointestinal: Positive for diarrhea. Negative for nausea and vomiting.  Musculoskeletal: Negative for arthralgias and myalgias.  Skin: Negative for rash.  Hematological: Negative.   Psychiatric/Behavioral: Negative.      Physical Exam Triage Vital Signs ED Triage Vitals  Enc Vitals Group     BP 12/07/20 1151 (!) 155/119     Pulse Rate 12/07/20 1151 80     Resp 12/07/20 1151 18     Temp 12/07/20 1153 98 F (36.7 C)     Temp Source 12/07/20 1151 Oral     SpO2 12/07/20 1151 96 %     Weight 12/07/20 1154 105 lb (47.6 kg)     Height 12/07/20 1154 6' (1.829 m)     Head Circumference --      Peak Flow --      Pain Score 12/07/20 1154 0     Pain Loc --      Pain Edu? --      Excl. in GC? --    No data found.  Updated Vital Signs BP (!) 155/119 (BP Location: Left Arm)   Pulse 80   Temp 98 F (36.7 C) (Oral)   Resp 18   Ht 6' (1.829 m)   Wt 105 lb (47.6 kg)   SpO2 96%   BMI 14.24 kg/m   Visual Acuity Right Eye Distance:   Left Eye Distance:   Bilateral Distance:    Right Eye Near:   Left Eye Near:    Bilateral Near:     Physical Exam Vitals and nursing note reviewed.  Constitutional:      General: He is not in acute distress.    Appearance: Normal appearance. He is well-developed and normal weight. He is not toxic-appearing.  HENT:     Head: Normocephalic and atraumatic.     Right Ear: Tympanic membrane, ear canal and external  ear normal.     Left Ear: Tympanic membrane, ear canal and external ear normal.     Nose: Congestion and rhinorrhea present.     Comments: Mucosa is erythematous and edematous with clear nasal discharge.    Mouth/Throat:     Mouth: Mucous membranes are moist.     Pharynx: Oropharynx is clear. No  oropharyngeal exudate or posterior oropharyngeal erythema.  Cardiovascular:     Rate and Rhythm: Normal rate and regular rhythm.     Pulses: Normal pulses.     Heart sounds: Normal heart sounds. No murmur heard. No gallop.   Pulmonary:     Effort: Pulmonary effort is normal.     Breath sounds: Normal breath sounds. No wheezing or rales.  Musculoskeletal:     Cervical back: Normal range of motion and neck supple.  Lymphadenopathy:     Cervical: No cervical adenopathy.  Skin:    General: Skin is warm and dry.     Capillary Refill: Capillary refill takes less than 2 seconds.     Findings: No erythema or rash.  Neurological:     General: No focal deficit present.     Mental Status: He is alert and oriented to person, place, and time.  Psychiatric:        Mood and Affect: Mood normal.        Behavior: Behavior normal.        Thought Content: Thought content normal.        Judgment: Judgment normal.      UC Treatments / Results  Labs (all labs ordered are listed, but only abnormal results are displayed) Labs Reviewed  SARS CORONAVIRUS 2 (TAT 6-24 HRS)    EKG   Radiology No results found.  Procedures Procedures (including critical care time)  Medications Ordered in UC Medications - No data to display  Initial Impression / Assessment and Plan / UC Course  I have reviewed the triage vital signs and the nursing notes.  Pertinent labs & imaging results that were available during my care of the patient were reviewed by me and considered in my medical decision making (see chart for details).   Patient is a very pleasant 75 year old male here for evaluation of multiple upper  respiratory complaints and constitutional complaints that been going on for the past week.  Patient's physical exam reveals some erythematous edematous nasal mucosa with clear nasal discharge but the remainder is upper respiratory exam is negative.  Patient's lungs are clear to auscultation all fields.  Patient has been vaccinating his Covid but I suspect that that is what this patient has.  Will swab for Covid and discharge patient home to isolate pending the results.   Final Clinical Impressions(s) / UC Diagnoses   Final diagnoses:  Acute upper respiratory infection     Discharge Instructions     Isolate at home until the results of your Covid test are back.  If you test positive then you will have to quarantine for 5 days from your symptoms started.  After 5 days you can break quarantine if your symptoms have improved and you have not had a fever for 24 hours.  Use the Promethazine DM cough syrup every 6 hours as needed for cough and congestion.  This will make you drowsy so you may want to save it for bedtime.  Use your albuterol inhaler as needed for shortness of breath and wheezing.  If you develop worsening shortness of breath-especially at rest, you are unable to speak in full sentences, or is a late sign your lips start turning blue you need to go to the ER for evaluation.    ED Prescriptions    Medication Sig Dispense Auth. Provider   promethazine-dextromethorphan (PROMETHAZINE-DM) 6.25-15 MG/5ML syrup Take 5 mLs by mouth 4 (four) times daily as needed. 118 mL Becky Augusta, NP  PDMP not reviewed this encounter.   Becky Augusta, NP 12/07/20 1229

## 2020-12-08 LAB — SARS CORONAVIRUS 2 (TAT 6-24 HRS): SARS Coronavirus 2: NEGATIVE

## 2021-01-06 ENCOUNTER — Other Ambulatory Visit: Payer: Self-pay

## 2021-01-06 ENCOUNTER — Observation Stay: Payer: Medicare HMO

## 2021-01-06 ENCOUNTER — Observation Stay
Admission: EM | Admit: 2021-01-06 | Discharge: 2021-01-08 | Disposition: A | Discharge status: Home / Self Care | Payer: Medicare HMO | Attending: Internal Medicine | Admitting: Internal Medicine

## 2021-01-06 ENCOUNTER — Emergency Department: Payer: Medicare HMO

## 2021-01-06 ENCOUNTER — Observation Stay: Admit: 2021-01-06 | Payer: Medicare HMO

## 2021-01-06 ENCOUNTER — Encounter: Payer: Self-pay | Admitting: Emergency Medicine

## 2021-01-06 DIAGNOSIS — Z79899 Other long term (current) drug therapy: Secondary | ICD-10-CM | POA: Diagnosis not present

## 2021-01-06 DIAGNOSIS — Z7982 Long term (current) use of aspirin: Secondary | ICD-10-CM | POA: Diagnosis not present

## 2021-01-06 DIAGNOSIS — I639 Cerebral infarction, unspecified: Secondary | ICD-10-CM | POA: Diagnosis not present

## 2021-01-06 DIAGNOSIS — R4701 Aphasia: Secondary | ICD-10-CM | POA: Diagnosis present

## 2021-01-06 DIAGNOSIS — E43 Unspecified severe protein-calorie malnutrition: Secondary | ICD-10-CM | POA: Insufficient documentation

## 2021-01-06 DIAGNOSIS — I6350 Cerebral infarction due to unspecified occlusion or stenosis of unspecified cerebral artery: Secondary | ICD-10-CM | POA: Diagnosis not present

## 2021-01-06 DIAGNOSIS — J449 Chronic obstructive pulmonary disease, unspecified: Secondary | ICD-10-CM | POA: Diagnosis not present

## 2021-01-06 DIAGNOSIS — M4802 Spinal stenosis, cervical region: Secondary | ICD-10-CM | POA: Diagnosis not present

## 2021-01-06 DIAGNOSIS — Z20822 Contact with and (suspected) exposure to covid-19: Secondary | ICD-10-CM | POA: Insufficient documentation

## 2021-01-06 DIAGNOSIS — F17210 Nicotine dependence, cigarettes, uncomplicated: Secondary | ICD-10-CM | POA: Insufficient documentation

## 2021-01-06 DIAGNOSIS — I10 Essential (primary) hypertension: Secondary | ICD-10-CM | POA: Diagnosis not present

## 2021-01-06 HISTORY — DX: Cerebral infarction, unspecified: I63.9

## 2021-01-06 LAB — COMPREHENSIVE METABOLIC PANEL
ALT: 82 U/L — ABNORMAL HIGH (ref 0–44)
AST: 39 U/L (ref 15–41)
Albumin: 3.7 g/dL (ref 3.5–5.0)
Alkaline Phosphatase: 97 U/L (ref 38–126)
Anion gap: 8 (ref 5–15)
BUN: 15 mg/dL (ref 8–23)
CO2: 28 mmol/L (ref 22–32)
Calcium: 9.1 mg/dL (ref 8.9–10.3)
Chloride: 105 mmol/L (ref 98–111)
Creatinine, Ser: 0.86 mg/dL (ref 0.61–1.24)
GFR, Estimated: >60 mL/min (ref >60)
Glucose, Bld: 104 mg/dL — ABNORMAL HIGH (ref 70–99)
Potassium: 3.5 mmol/L (ref 3.5–5.1)
Sodium: 141 mmol/L (ref 135–145)
Total Bilirubin: 1.2 mg/dL (ref 0.3–1.2)
Total Protein: 7.1 g/dL (ref 6.5–8.1)

## 2021-01-06 LAB — CBG MONITORING, ED: Glucose-Capillary: 101 mg/dL — ABNORMAL HIGH (ref 70–99)

## 2021-01-06 LAB — CBC
HCT: 41.5 % (ref 39.0–52.0)
Hemoglobin: 13.9 g/dL (ref 13.0–17.0)
MCH: 32.6 pg (ref 26.0–34.0)
MCHC: 33.5 g/dL (ref 30.0–36.0)
MCV: 97.2 fL (ref 80.0–100.0)
Platelets: 138 10*3/uL — ABNORMAL LOW (ref 150–400)
RBC: 4.27 MIL/uL (ref 4.22–5.81)
RDW: 13.2 % (ref 11.5–15.5)
WBC: 7 10*3/uL (ref 4.0–10.5)
nRBC: 0 % (ref 0.0–0.2)

## 2021-01-06 LAB — DIFFERENTIAL
Abs Immature Granulocytes: 0.03 10*3/uL (ref 0.00–0.07)
Basophils Absolute: 0.1 10*3/uL (ref 0.0–0.1)
Basophils Relative: 1 %
Eosinophils Absolute: 0.2 10*3/uL (ref 0.0–0.5)
Eosinophils Relative: 2 %
Immature Granulocytes: 0 %
Lymphocytes Relative: 35 %
Lymphs Abs: 2.4 10*3/uL (ref 0.7–4.0)
Monocytes Absolute: 0.7 10*3/uL (ref 0.1–1.0)
Monocytes Relative: 11 %
Neutro Abs: 3.6 10*3/uL (ref 1.7–7.7)
Neutrophils Relative %: 51 %

## 2021-01-06 LAB — RESP PANEL BY RT-PCR (FLU A&B, COVID) ARPGX2
Influenza A by PCR: NEGATIVE
Influenza B by PCR: NEGATIVE
SARS Coronavirus 2 by RT PCR: NEGATIVE

## 2021-01-06 LAB — PROTIME-INR
INR: 1 (ref 0.8–1.2)
Prothrombin Time: 12.7 seconds (ref 11.4–15.2)

## 2021-01-06 LAB — APTT: aPTT: 28 seconds (ref 24–36)

## 2021-01-06 MED ORDER — ENSURE ENLIVE PO LIQD
237.0000 mL | Freq: Two times a day (BID) | ORAL | Status: DC
  Administered 2021-01-07 – 2021-01-08 (×3): 237 mL via ORAL

## 2021-01-06 MED ORDER — NICOTINE 21 MG/24HR TD PT24
21.0000 mg | MEDICATED_PATCH | Freq: Every day | TRANSDERMAL | Status: DC
  Administered 2021-01-06 – 2021-01-08 (×3): 21 mg via TRANSDERMAL
  Filled 2021-01-06 (×3): qty 1

## 2021-01-06 MED ORDER — LATANOPROST 0.005 % OP SOLN
1.0000 [drp] | Freq: Every day | OPHTHALMIC | Status: DC
  Administered 2021-01-06 – 2021-01-07 (×2): 1 [drp] via OPHTHALMIC
  Filled 2021-01-06: qty 2.5

## 2021-01-06 MED ORDER — ACETAMINOPHEN 325 MG PO TABS: 650.0000 mg | ORAL_TABLET | ORAL | Status: DC | PRN

## 2021-01-06 MED ORDER — SENNOSIDES-DOCUSATE SODIUM 8.6-50 MG PO TABS: 1.0000 | ORAL_TABLET | Freq: Every evening | ORAL | Status: DC | PRN

## 2021-01-06 MED ORDER — IOHEXOL 350 MG/ML SOLN
75.0000 mL | Freq: Once | INTRAVENOUS | Status: AC | PRN
  Administered 2021-01-06: 75 mL via INTRAVENOUS

## 2021-01-06 MED ORDER — BRIMONIDINE TARTRATE 0.2 % OP SOLN
1.0000 [drp] | Freq: Every day | OPHTHALMIC | Status: DC
  Administered 2021-01-07 – 2021-01-08 (×2): 1 [drp] via OPHTHALMIC
  Filled 2021-01-06: qty 5

## 2021-01-06 MED ORDER — FLUTICASONE FUROATE-VILANTEROL 100-25 MCG/INH IN AEPB
1.0000 | INHALATION_SPRAY | Freq: Every day | RESPIRATORY_TRACT | Status: DC
  Filled 2021-01-06: qty 28

## 2021-01-06 MED ORDER — LOSARTAN POTASSIUM 50 MG PO TABS
50.0000 mg | ORAL_TABLET | Freq: Every day | ORAL | Status: DC
Start: 2021-01-06 — End: 2021-01-08
  Administered 2021-01-06 – 2021-01-08 (×3): 50 mg via ORAL
  Filled 2021-01-06 (×3): qty 1

## 2021-01-06 MED ORDER — ACETAMINOPHEN 160 MG/5ML PO SOLN
650.0000 mg | ORAL | Status: DC | PRN
  Filled 2021-01-06: qty 20.3

## 2021-01-06 MED ORDER — ALBUTEROL SULFATE HFA 108 (90 BASE) MCG/ACT IN AERS
2.0000 | INHALATION_SPRAY | Freq: Two times a day (BID) | RESPIRATORY_TRACT | Status: DC
  Administered 2021-01-06 – 2021-01-08 (×4): 2 via RESPIRATORY_TRACT
  Filled 2021-01-06: qty 6.7

## 2021-01-06 MED ORDER — UMECLIDINIUM BROMIDE 62.5 MCG/INH IN AEPB
1.0000 | INHALATION_SPRAY | Freq: Every day | RESPIRATORY_TRACT | Status: DC
  Filled 2021-01-06: qty 7

## 2021-01-06 MED ORDER — DORZOLAMIDE HCL-TIMOLOL MAL 2-0.5 % OP SOLN
1.0000 [drp] | Freq: Every day | OPHTHALMIC | Status: DC
  Administered 2021-01-07 – 2021-01-08 (×2): 1 [drp] via OPHTHALMIC
  Filled 2021-01-06: qty 10

## 2021-01-06 MED ORDER — FLUTICASONE-UMECLIDIN-VILANT 100-62.5-25 MCG/INH IN AEPB: 1.0000 | INHALATION_SPRAY | Freq: Every day | RESPIRATORY_TRACT | Status: DC

## 2021-01-06 MED ORDER — ENOXAPARIN SODIUM 40 MG/0.4ML ~~LOC~~ SOLN
40.0000 mg | SUBCUTANEOUS | Status: DC
  Administered 2021-01-06 – 2021-01-07 (×2): 40 mg via SUBCUTANEOUS
  Filled 2021-01-06 (×2): qty 0.4

## 2021-01-06 MED ORDER — ATORVASTATIN CALCIUM 20 MG PO TABS
40.0000 mg | ORAL_TABLET | Freq: Every day | ORAL | Status: DC
  Administered 2021-01-06 – 2021-01-08 (×3): 40 mg via ORAL
  Filled 2021-01-06 (×3): qty 2

## 2021-01-06 MED ORDER — TIOTROPIUM BROMIDE MONOHYDRATE 18 MCG IN CAPS: 18.0000 ug | ORAL_CAPSULE | Freq: Every day | RESPIRATORY_TRACT | Status: DC

## 2021-01-06 MED ORDER — ACETAMINOPHEN 650 MG RE SUPP: 650.0000 mg | RECTAL | Status: DC | PRN

## 2021-01-06 MED ORDER — STROKE: EARLY STAGES OF RECOVERY BOOK: Freq: Once | Status: AC

## 2021-01-06 MED ORDER — ASPIRIN EC 81 MG PO TBEC
81.0000 mg | DELAYED_RELEASE_TABLET | Freq: Every day | ORAL | Status: DC
  Administered 2021-01-06 – 2021-01-08 (×3): 81 mg via ORAL
  Filled 2021-01-06 (×3): qty 1

## 2021-01-06 NOTE — Progress Notes (Signed)
   01/06/21 1320  Clinical Encounter Type  Visited With Patient  Visit Type Initial;Code;Social support;Spiritual support  Referral From Nurse  Consult/Referral To Chaplain   Chaplain responded to a Code [employer]. Chaplain checked on PT and  staff to assess the situation. Chaplain served as a calming presence for the PT and staff. Nurse stated, "they were good". Chaplain told staff to page chaplain if needed.

## 2021-01-06 NOTE — Evaluation (Signed)
Physical Therapy Evaluation Patient Details Name: Christopher Macdonald MRN: [id number] DOB: 03-16-1946 Today's Date: 01/06/2021   History of Present Illness  Per MD notes: Pt is a 75 y.o. male with history of COPD, CVA, depression, essential hypertension, hepatitis, tobacco abuse who presented to the hospital with a slurred speech and left facial droop.  MD assessment includes: Acute ischemic stroke and severe protein calorie malnutrition.    Clinical Impression  Pt was pleasant and motivated to participate during the session and overall performed well.  Pt's BLE and BUE strength, sensation, and coordination were all WNL. No visual or gross speech deficits noted.  Pt required no assistance with any functional tasks and presented with fair stability during ambulation.  Pt struggled with LLE SLS presenting with min to mod instability and only able to hold the position for 1-2 seconds with min A.  Pt was able to stand for 8 sec on the RLE with no assistance needed.  Pt's instability during LLE SLS testing did not translate during functional tasks and he was able to amb 150 feet without an AD with only min drifting left/right and minimal decreased stance time on the LLE.  Pt declined offer to test amb with an AD.  Pt did report feeling tired after ambulation but SpO2 and HR were both WNL.  Pt also did report 3 falls in the last 6 months secondary to "losing my balance" and will benefit from HHPT services upon discharge to safely address deficits listed in patient problem list for decreased caregiver assistance and eventual return to PLOF.          Follow Up Recommendations Home health PT    Equipment Recommendations  Other (comment) (Pt declined use of AD)    Recommendations for Other Services       Precautions / Restrictions Precautions Precautions: None Restrictions Weight Bearing Restrictions: No      Mobility  Bed Mobility Overal bed mobility: Independent             General bed  mobility comments: Good speed and effort with bed mobility tasks    Transfers Overall transfer level: Needs assistance Equipment used: None Transfers: Sit to/from Stand Sit to Stand: Supervision         General transfer comment: Good control and stability with transfers  Ambulation/Gait Ambulation/Gait assistance: Supervision Gait Distance (Feet): 150 Feet Assistive device: None Gait Pattern/deviations: Step-through pattern;Decreased step length - right;Decreased step length - left;Trunk flexed Gait velocity: decreased   General Gait Details: Slow cadence with short B step length and very mild drifting left/right and with min decreased stance time on the LLE  Stairs            Wheelchair Mobility    Modified Rankin (Stroke Patients Only)       Balance Overall balance assessment: Needs assistance   Sitting balance-Leahy Scale: Normal     Standing balance support: No upper extremity supported Standing balance-Leahy Scale: Fair Standing balance comment: Pt steady with feet together and semi tandem with eyes closed and with feet apart with head turns; min to mod instability during SLS testing on the LLE only, no LOB on the RLE Single Leg Stance - Right Leg: 8 Single Leg Stance - Left Leg: 2                         Pertinent Vitals/Pain Pain Assessment: No/denies pain    Home Living Family/patient expects to be discharged to:: Private  residence Living Arrangements: Non-relatives/Friends Available Help at Discharge: Friend(s);Available 24 hours/day Type of Home: Mobile home Home Access: Stairs to enter Entrance Stairs-Rails: Right;Left;Can reach both Entrance Stairs-Number of Steps: 5 Home Layout: One level   Additional Comments: Owns a walking stick    Prior Function Level of Independence: Independent         Comments: Ind amb limited community distances without an AD, 3 falls in the last 6 months secondary to LOB, Ind with ADLs     Hand  Dominance   Dominant Hand: Right    Extremity/Trunk Assessment   Upper Extremity Assessment Upper Extremity Assessment: Overall WFL for tasks assessed    Lower Extremity Assessment Lower Extremity Assessment: Overall WFL for tasks assessed       Communication   Communication: No difficulties  Cognition Arousal/Alertness: Awake/alert Behavior During Therapy: WFL for tasks assessed/performed Overall Cognitive Status: Within Functional Limits for tasks assessed                                        General Comments      Exercises Other Exercises Other Exercises: Pt education on physiological benefits of activity and dyspnea spiral   Assessment/Plan    PT Assessment Patient needs continued PT services  PT Problem List Decreased activity tolerance;Decreased balance       PT Treatment Interventions DME instruction;Gait training;Stair training;Functional mobility training;Therapeutic activities;Therapeutic exercise;Balance training;Patient/family education    PT Goals (Current goals can be found in the Care Plan section)  Acute Rehab PT Goals Patient Stated Goal: To get back home PT Goal Formulation: With patient Time For Goal Achievement: 01/19/21 Potential to Achieve Goals: Good    Frequency Min 2X/week   Barriers to discharge        Co-evaluation               AM-PAC PT "6 Clicks" Mobility  Outcome Measure Help needed turning from your back to your side while in a flat bed without using bedrails?: None Help needed moving from lying on your back to sitting on the side of a flat bed without using bedrails?: None Help needed moving to and from a bed to a chair (including a wheelchair)?: A Little Help needed standing up from a chair using your arms (e.g., wheelchair or bedside chair)?: A Little Help needed to walk in hospital room?: A Little Help needed climbing 3-5 steps with a railing? : A Little 6 Click Score: 20    End of Session  Equipment Utilized During Treatment: Gait belt Activity Tolerance: Patient tolerated treatment well Patient left: in bed;with call bell/phone within reach;with bed alarm set;Other (comment) (pt declined up in chair) Nurse Communication: Mobility status PT Visit Diagnosis: History of falling (Z91.81);Difficulty in walking, not elsewhere classified (R26.2)    Time: 2426-8341 PT Time Calculation (min) (ACUTE ONLY): 26 min   Charges:   PT Evaluation $PT Eval Moderate Complexity: 1 Mod          D. Scott Tanner PT, DPT 01/06/21, 5:29 PM

## 2021-01-06 NOTE — Consult Note (Signed)
Referring Physician: Dr. Fuller Plan    Chief Complaint: Acute onset of slurred speech and left facial droop  HPI: Christopher Macdonald is an 75 y.o. male presenting to the ED with new onset of left facial droop, left facial numbness and slurred speech. LKN was at 9 PM last night before going to bed. He woke up at 0700 today with the symptoms. His PMHx includes 1 ppd smoking for 60 years, COPD, glaucoma, depression, hepatitis and HTN. A Code Stroke was called after arrival to the ED.   CT head in the ED shows no acute intracranial hemorrhage or definite acute infarction. 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  CTA of head and neck: No large vessel occlusion. Plaque at the ICA origins causes less than 50% stenosis. 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  His home medications include ASA and atorvastatin.   LSN: 9:00 PM on Tuesday tPA Given: No: Out of the time window.   Past Medical History:  Diagnosis Date  . COPD (chronic obstructive pulmonary disease) (HCC)   . Depression   . Elevated PSA   . Glaucoma   . Hepatitis   . HTN (hypertension)   . Tobacco abuse     Past Surgical History:  Procedure Laterality Date  . cataract sugery Right   . GLAUCOMA SURGERY  2015  . HERNIA REPAIR  2014    Family History  Problem Relation Age of Onset  . CAD Mother   . Stroke Father   . Prostate cancer Neg Hx   . Kidney cancer Neg Hx   . Bladder Cancer Neg Hx    Social History:  reports that he has been smoking cigarettes. He has a 63.00 pack-year smoking history. He has quit using smokeless tobacco.  His smokeless tobacco use included chew. He reports current alcohol use. He reports current drug use. Drug: Marijuana.  Allergies:  Allergies  Allergen Reactions  . Wasp Venom  Swelling    Home Medications:  No current facility-administered medications on file prior to encounter.   Current Outpatient Medications on File Prior to Encounter  Medication Sig Dispense Refill  . albuterol (PROVENTIL HFA;VENTOLIN HFA) 108 (90 Base) MCG/ACT inhaler Inhale 2 puffs into the lungs 2 (two) times daily.     Marland Kitchen aspirin EC 81 MG tablet Take 81 mg by mouth daily.     Marland Kitchen atorvastatin (LIPITOR) 40 MG tablet Take 40 mg by mouth daily.    . brimonidine (ALPHAGAN) 0.2 % ophthalmic solution Place 1 drop into the right eye in the morning and at bedtime.    . dorzolamide-timolol (COSOPT) 22.3-6.8 MG/ML ophthalmic solution Place 1 drop into the right eye 2 (two) times daily.    Marland Kitchen losartan (COZAAR) 50 MG tablet Take 50 mg by mouth daily.     Marland Kitchen LUMIGAN 0.01 % SOLN Place 1 drop into the right eye daily.    . TRELEGY ELLIPTA 100-62.5-25 MCG/INH AEPB Inhale 1 puff into the lungs daily.    . [DISCONTINUED] gabapentin (NEURONTIN) 100 MG capsule Take 500 mg by mouth daily.     . [DISCONTINUED] potassium chloride SA (K-DUR,KLOR-CON) 20 MEQ tablet Take 2 tablets (40 mEq total) by mouth daily. 3 tablet 0     ROS: As per HPI. No additional complaints.    Physical Examination: Blood pressure (!) 187/96,  pulse 68, resp. rate 20, height 6' (1.829 m), weight 52.2 kg, SpO2 97 %.  General: Diffusely decreased muscle bulk. Marland Kitchen  HEENT: [place]/AT Lungs: Respirations unlabored Ext: No edema. Noncyanotic and anicteric.  Neurologic Examination: Mental Status: Alert, fully oriented, thought content appropriate.  Speech fluent without evidence of aphasia.  Able to follow all commands without difficulty. Cranial Nerves: II:  Visual fields intact with no extinction to DSS. PERRL.  III,IV, VI: No ptosis. EOMI with saccadic quality of visual pursuits noted. No nystagmus.  V,VII: Decreased NL fold on the left.  VIII: Hearing intact to voice IX,X: Palate rises symmetrically XI: Head is midline XII: Midline tongue  extension  Motor: BUE 5/5 proximally and distally. No pronator drift. Tone normal.  BLE 5/5 proximally and distally. Tone normal.  Sensory: Temp and light touch intact throughout, bilaterally. No extinction to DSS.  Deep Tendon Reflexes:  2+ bilateral brachioradialis and biceps.  2+ bilateral patellae 2+ bilateral achilles Cerebellar: No ataxia with FNF bilaterally  Gait: Able to stand with own power. Lists to the left when ambulating, which worsens with attempted tandem gait. Stance is narrow based. Negative Romberg. No retropulsion noted.     Results for orders placed or performed during the hospital encounter of 01/06/21 (from the past 48 hour(s))  Protime-INR     Status: None   Collection Time: 01/06/21  1:03 PM  Result Value Ref Range   Prothrombin Time 12.7 11.4 - 15.2 seconds   INR 1.0 0.8 - 1.2    Comment: (NOTE) INR goal varies based on device and disease states. Performed at [facility], [address] [place], [place] [address]   APTT     Status: None   Collection Time: 01/06/21  1:03 PM  Result Value Ref Range   aPTT 28 24 - 36 seconds    Comment: Performed at [facility], [address] [place], [place] [address]  CBC     Status: Abnormal   Collection Time: 01/06/21  1:03 PM  Result Value Ref Range   WBC 7.0 4.0 - 10.5 K/uL   RBC 4.27 4.22 - 5.81 MIL/uL   Hemoglobin 13.9 13.0 - 17.0 g/dL   HCT 10.2 58.5 - 27.7 %   MCV 97.2 80.0 - 100.0 fL   MCH 32.6 26.0 - 34.0 pg   MCHC 33.5 30.0 - 36.0 g/dL   RDW 82.4 23.5 - 36.1 %   Platelets 138 (L) 150 - 400 K/uL   nRBC 0.0 0.0 - 0.2 %    Comment: Performed at [facility], [address] [place], [place] [address]  Differential     Status: None   Collection Time: 01/06/21  1:03 PM  Result Value Ref Range   Neutrophils Relative % 51 %   Neutro Abs 3.6 1.7 - 7.7 K/uL   Lymphocytes Relative 35 %   Lymphs Abs 2.4 0.7 - 4.0 K/uL   Monocytes Relative 11 %   Monocytes Absolute 0.7 0.1  - 1.0 K/uL   Eosinophils Relative 2 %   Eosinophils Absolute 0.2 0.0 - 0.5 K/uL   Basophils Relative 1 %   Basophils Absolute 0.1 0.0 - 0.1 K/uL   Immature Granulocytes 0 %   Abs Immature Granulocytes 0.03 0.00 - 0.07 K/uL    Comment: Performed at [facility], [address] [place], [place] [address]  CBG monitoring, ED     Status: Abnormal   Collection Time: 01/06/21  1:05 PM  Result Value Ref  Range   Glucose-Capillary 101 (H) 70 - 99 mg/dL    Comment: Glucose reference range applies only to samples taken after fasting for at least 8 hours.   No results found.  Assessment: 75 y.o. male presenting to the ED with new onset of left facial droop, left facial numbness and slurred speech. LKN was at 9 PM last night before going to bed. He woke up at 0700 today with the symptoms.  1. Exam reveals no focal limb weakness or numbness, but there is mild left facial droop and listing to the left during ambulation.  2. CT head: No acute intracranial hemorrhage or definite acute infarction. 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 3. CTA of head and neck: No large vessel occlusion. Plaque at the ICA origins causes less than 50% stenosis. 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 4. Stroke Risk Factors - Tobacco abuse and HTN.   Recommendations: 1. HgbA1c, fasting lipid panel 2. MRI of the brain without contrast 3. PT consult, OT consult, Speech consult 4. Echocardiogram 5. Cardiac telemetry 6. Prophylactic therapy- Continue ASA and atorvastatin. If MRI reveals a stroke, add Plavix to ASA.  7. Risk factor modification 8. Frequent neuro checks 9. Permissive HTN x 24 hours. Use modified parameters given his age. Treat if SBP > 200. After 24 hours, correct SBP by 15% per day  to goal of 120-140 10. Will also need MRI of C-spine without contrast to further assess the severe spinal canal stenosis seen at the C3-4 level on CTA.    @Electronically  signed: Dr. 01/06/2021, 1:28 PM

## 2021-01-06 NOTE — ED Notes (Signed)
Pt in CT.

## 2021-01-06 NOTE — ED Notes (Signed)
Neurologist examining pt.

## 2021-01-06 NOTE — ED Notes (Signed)
Neurologist at bedside. 

## 2021-01-06 NOTE — Progress Notes (Signed)
OT Cancellation Note  Patient Details Name: Christopher Macdonald MRN: [id number] DOB: [birth year]   Cancelled Treatment:    Reason Eval/Treat Not Completed: Patient at procedure or test/ unavailable  Pt OTF for MRI at this time. Will f/u for OT evaluation next available  date/time. Thank you.  Rejeana Brock, MS, OTR/L ascom [phone] 01/06/21, 5:36 PM

## 2021-01-06 NOTE — ED Triage Notes (Signed)
Pt to ED via POV with c/o facial droop and slurred speech. Pt states symptoms started in his sleep last night. Pt states LKW 01/05/21 unable to state time, pt states woke up this morning at approx 0700 and noticed symptoms. Pt states numbness to L side of his face at this time.

## 2021-01-06 NOTE — ED Notes (Signed)
Provided ice water and warm blankets per pt request.

## 2021-01-06 NOTE — H&P (Signed)
Admission H&P    Chief Complaint: Slurred speech HPI: Christopher Macdonald is an 75 y.o. male with history of COPD, depression, essential hypertension, hepatitis, tobacco abuse who present to the hospital with a slurred speech and left facial droop. Patient states that he went to sleep last night without any symptoms, when he woke up this morning, he experienced left facial droop with slurred speech.  He cannot understand words, but just words are slurred.  He did not have any weakness in the arms or legs, no headache or dizziness.  No nausea vomiting.  No visual defects. He has been seen by neurology, recommended admit to the hospital for further work-up.  Patient is not a candidate for thrombolytic treatment due to uncertain onset of symptoms.  History of Stroke: Yes.    Date last known well:The prior night Time last known well: The prior night  tPA Given: No MRankin:   Past Medical History:  Diagnosis Date  . COPD (chronic obstructive pulmonary disease) (HCC)   . Depression   . Elevated PSA   . Glaucoma   . Hepatitis   . HTN (hypertension)   . Tobacco abuse     Past Surgical History:  Procedure Laterality Date  . cataract sugery Right   . GLAUCOMA SURGERY  2015  . HERNIA REPAIR  2014    Family History  Problem Relation Age of Onset  . CAD Mother   . Stroke Father   . Prostate cancer Neg Hx   . Kidney cancer Neg Hx   . Bladder Cancer Neg Hx    Social History:  reports that he has been smoking cigarettes. He has a 63.00 pack-year smoking history. He has quit using smokeless tobacco.  His smokeless tobacco use included chew. He reports current alcohol use. He reports current drug use. Drug: Marijuana.  Allergies:  Allergies  Allergen Reactions  . Wasp Venom Swelling    (Not in a hospital admission)   ROS: Review of Systems  Constitutional: Positive for weight loss. Negative for chills, diaphoresis, fever and malaise/fatigue.  HENT: Negative for congestion and sinus  pain.   Eyes: Negative for blurred vision and double vision.  Respiratory: Positive for cough and shortness of breath. Negative for wheezing.   Cardiovascular: Negative for chest pain, palpitations and orthopnea.  Gastrointestinal: Negative for constipation, diarrhea, nausea and vomiting.       Poor appetitie  Genitourinary: Negative for flank pain, frequency and hematuria.  Musculoskeletal: Negative for back pain, myalgias and neck pain.  Skin: Negative for itching and rash.  Neurological: Positive for speech change. Negative for dizziness, focal weakness, weakness and headaches.  Psychiatric/Behavioral: Positive for depression. Negative for substance abuse and suicidal ideas.    Physical Examination: Blood pressure (!) 163/77, pulse 68, resp. rate (!) 29, height 6' (1.829 m), weight 52.2 kg, SpO2 99 %.  HEENT-  Normocephalic, no lesions, without obvious abnormality.  Normal external eye and conjunctiva.  Normal TM's bilaterally.  Normal auditory canals and external ears. Normal external nose, mucus membranes and septum.  Normal pharynx. Neck supple with no masses, nodes, nodules or enlargement. Cardiovascular - regular rate and rhythm, S1, S2 normal, no murmur, click, rub or gallop Lungs -decreased breathing sounds without crackles or wheezes. Abdomen - soft, non-tender; bowel sounds normal; no masses,  no organomegaly Extremities -no edema, significant muscle atrophy.  Neurologic Examination: Appear malnourished.  Mild left facial droop.  Muscle strength normal, DTRs normal.  Results for orders placed or performed during the hospital  encounter of 01/06/21 (from the past 48 hour(s))  Protime-INR     Status: None   Collection Time: 01/06/21  1:03 PM  Result Value Ref Range   Prothrombin Time 12.7 11.4 - 15.2 seconds   INR 1.0 0.8 - 1.2    Comment: (NOTE) INR goal varies based on device and disease states. Performed at [facility], [address] [place], [place]  [address]   APTT     Status: None   Collection Time: 01/06/21  1:03 PM  Result Value Ref Range   aPTT 28 24 - 36 seconds    Comment: Performed at [facility], [address] [place], [place] [address]  CBC     Status: Abnormal   Collection Time: 01/06/21  1:03 PM  Result Value Ref Range   WBC 7.0 4.0 - 10.5 K/uL   RBC 4.27 4.22 - 5.81 MIL/uL   Hemoglobin 13.9 13.0 - 17.0 g/dL   HCT 09.8 11.9 - 14.7 %   MCV 97.2 80.0 - 100.0 fL   MCH 32.6 26.0 - 34.0 pg   MCHC 33.5 30.0 - 36.0 g/dL   RDW 82.9 56.2 - 13.0 %   Platelets 138 (L) 150 - 400 K/uL   nRBC 0.0 0.0 - 0.2 %    Comment: Performed at [facility], [address] [place], [place] [address]  Differential     Status: None   Collection Time: 01/06/21  1:03 PM  Result Value Ref Range   Neutrophils Relative % 51 %   Neutro Abs 3.6 1.7 - 7.7 K/uL   Lymphocytes Relative 35 %   Lymphs Abs 2.4 0.7 - 4.0 K/uL   Monocytes Relative 11 %   Monocytes Absolute 0.7 0.1 - 1.0 K/uL   Eosinophils Relative 2 %   Eosinophils Absolute 0.2 0.0 - 0.5 K/uL   Basophils Relative 1 %   Basophils Absolute 0.1 0.0 - 0.1 K/uL   Immature Granulocytes 0 %   Abs Immature Granulocytes 0.03 0.00 - 0.07 K/uL    Comment: Performed at [facility], [address] [place], [place] [address]  Comprehensive metabolic panel     Status: Abnormal   Collection Time: 01/06/21  1:03 PM  Result Value Ref Range   Sodium 141 135 - 145 mmol/L   Potassium 3.5 3.5 - 5.1 mmol/L   Chloride 105 98 - 111 mmol/L   CO2 28 22 - 32 mmol/L   Glucose, Bld 104 (H) 70 - 99 mg/dL    Comment: Glucose reference range applies only to samples taken after fasting for at least 8 hours.   BUN 15 8 - 23 mg/dL   Creatinine, Ser 9.52 0.61 - 1.24 mg/dL   Calcium 9.1 8.9 - 84.1 mg/dL   Total Protein 7.1 6.5 - 8.1 g/dL   Albumin 3.7 3.5 - 5.0 g/dL   AST 39 15 - 41 U/L   ALT 82 (H) 0 - 44 U/L   Alkaline Phosphatase 97 38 - 126 U/L   Total Bilirubin 1.2 0.3 -  1.2 mg/dL   GFR, Estimated >32 >44 mL/min    Comment: (NOTE) Calculated using the CKD-EPI Creatinine Equation (2021)    Anion gap 8 5 - 15    Comment: Performed at [facility], [address] [place], [place] [address]  CBG monitoring, ED     Status: Abnormal   Collection Time: 01/06/21  1:05 PM  Result Value Ref Range   Glucose-Capillary 101 (H) 70 -  99 mg/dL    Comment: Glucose reference range applies only to samples taken after fasting for at least 8 hours.  Resp Panel by RT-PCR (Flu A&B, Covid) Nasopharyngeal Swab     Status: None   Collection Time: 01/06/21  1:51 PM   Specimen: Nasopharyngeal Swab; Nasopharyngeal(NP) swabs in vial transport medium  Result Value Ref Range   SARS Coronavirus 2 by RT PCR NEGATIVE NEGATIVE    Comment: (NOTE) SARS-CoV-2 target nucleic acids are NOT DETECTED.  The SARS-CoV-2 RNA is generally detectable in upper respiratory specimens during the acute phase of infection. The lowest concentration of SARS-CoV-2 viral copies this assay can detect is 138 copies/mL. A negative result does not preclude SARS-Cov-2 infection and should not be used as the sole basis for treatment or other patient management decisions. A negative result may occur with  improper specimen collection/handling, submission of specimen other than nasopharyngeal swab, presence of viral mutation(s) within the areas targeted by this assay, and inadequate number of viral copies(<138 copies/mL). A negative result must be combined with clinical observations, patient history, and epidemiological information. The expected result is Negative.  Fact Sheet for Patients:  BloggerCourse.com  Fact Sheet for Healthcare Providers:  SeriousBroker.it  This test is no t yet approved or cleared by the [place] FDA and  has been authorized for detection and/or diagnosis of SARS-CoV-2 by FDA under an Emergency Use Authorization (EUA).  This EUA will remain  in effect (meaning this test can be used) for the duration of the COVID-19 declaration under Section 564(b)(1) of the Act, 21 U.S.C.section 360bbb-3(b)(1), unless the authorization is terminated  or revoked sooner.       Influenza A by PCR NEGATIVE NEGATIVE   Influenza B by PCR NEGATIVE NEGATIVE    Comment: (NOTE) The Xpert Xpress SARS-CoV-2/FLU/RSV plus assay is intended as an aid in the diagnosis of influenza from Nasopharyngeal swab specimens and should not be used as a sole basis for treatment. Nasal washings and aspirates are unacceptable for Xpert Xpress SARS-CoV-2/FLU/RSV testing.  Fact Sheet for Patients: BloggerCourse.com  Fact Sheet for Healthcare Providers: SeriousBroker.it  This test is not yet approved or cleared by the [place] FDA and has been authorized for detection and/or diagnosis of SARS-CoV-2 by FDA under an Emergency Use Authorization (EUA). This EUA will remain in effect (meaning this test can be used) for the duration of the COVID-19 declaration under Section 564(b)(1) of the Act, 21 U.S.C. section 360bbb-3(b)(1), unless the authorization is terminated or revoked.  Performed at [facility], [address] [place], [place] [address]    CT ANGIO HEAD W OR WO CONTRAST  Result Date: 01/06/2021 CLINICAL DATA:  Facial droop and abnormal speech EXAM: CT ANGIOGRAPHY HEAD AND NECK TECHNIQUE: Multidetector CT imaging of the head and neck was performed using the standard protocol during bolus administration of intravenous contrast. Multiplanar CT image reconstructions and MIPs were obtained to evaluate the vascular anatomy. Carotid stenosis measurements (when applicable) are obtained utilizing NASCET criteria, using the distal internal carotid diameter as the denominator. CONTRAST:  79mL OMNIPAQUE IOHEXOL 350 MG/ML SOLN COMPARISON:  None. FINDINGS: CT HEAD Brain: There is no acute  intracranial hemorrhage or mass effect. 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 Suspected age-indeterminate small vessel infarct of the right caudate body and adjacent white matter. There is no extra-axial fluid collection. Prominence of the ventricles and sulci reflects mild parenchymal volume  loss. There is mild superior cerebellar volume loss. Vascular: No hyperdense vessel. Skull: Calvarium is unremarkable. Sinuses/Orbits: No acute finding. Other: None. Review of the MIP images confirms the above findings CTA NECK Aortic arch: Mixed plaque along the aortic arch and at the patent great vessel origins. Right carotid system: Patent. Mixed but primarily calcified plaque at the proximal ICA causing less than 50% stenosis. Left carotid system: Patent. Mixed but primarily calcified plaque at the proximal ICA causing less than 50% stenosis. Vertebral arteries: Patent. Left vertebral artery is slightly dominant. No measurable stenosis or evidence of dissection. Skeleton: Degenerative changes of the cervical spine, greatest at C3-C4 where there is retrolisthesis, disc osteophyte complex, and uncovertebral hypertrophy. Resulting severe canal and foraminal stenosis. Chronic thoracic compression fractures. Other neck: No mass or adenopathy. Upper chest: Advanced emphysema. Review of the MIP images confirms the above findings CTA HEAD Anterior circulation: Intracranial internal carotid arteries are patent with mixed but primarily calcified plaque no significant stenosis. There is a 1 mm inferiorly directed outpouching of the distal supraclinoid right ICA. Anterior and middle cerebral arteries are patent. Posterior circulation: Intracranial vertebral arteries, basilar artery, and posterior cerebral arteries are patent. Major cerebellar artery origins are patent. Venous sinuses: Patent as allowed by contrast bolus timing.  Review of the MIP images confirms the above findings IMPRESSION: No acute intracranial hemorrhage or definite acute infarction. Suspected age-indeterminate, potentially acute infarct of the right caudate body and adjacent white matter. No large vessel occlusion. Plaque at the ICA origins causes less than 50% stenosis. 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 Degenerative changes at C3-C4 with severe canal and foraminal stenosis. Initial results were called by telephone at the time of interpretation on 01/06/2021 at 1:45 pm to [facility] , who verbally acknowledged these results. Electronically Signed   By: Guadlupe SpanishPraneil  Patel M.D.   On: 01/06/2021 13:57   CT Code Stroke CTA Neck W/WO contrast  Result Date: 01/06/2021 CLINICAL DATA:  Facial droop and abnormal speech EXAM: CT ANGIOGRAPHY HEAD AND NECK TECHNIQUE: Multidetector CT imaging of the head and neck was performed using the standard protocol during bolus administration of intravenous contrast. Multiplanar CT image reconstructions and MIPs were obtained to evaluate the vascular anatomy. Carotid stenosis measurements (when applicable) are obtained utilizing NASCET criteria, using the distal internal carotid diameter as the denominator. CONTRAST:  75mL OMNIPAQUE IOHEXOL 350 MG/ML SOLN COMPARISON:  None. FINDINGS: CT HEAD Brain: There is no acute intracranial hemorrhage or mass effect. 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 Suspected age-indeterminate small vessel infarct of the right caudate body and adjacent white matter. There is no extra-axial fluid collection. Prominence of the ventricles and sulci reflects mild parenchymal volume loss. There is mild superior cerebellar volume loss. Vascular: No hyperdense vessel. Skull: Calvarium is unremarkable. Sinuses/Orbits: No acute finding. Other: None. Review of the MIP images confirms the above  findings CTA NECK Aortic arch: Mixed plaque along the aortic arch and at the patent great vessel origins. Right carotid system: Patent. Mixed but primarily calcified plaque at the proximal ICA causing less than 50% stenosis. Left carotid system: Patent. Mixed but primarily calcified plaque at the proximal ICA causing less than 50% stenosis. Vertebral arteries: Patent. Left vertebral artery is slightly dominant. No measurable stenosis or evidence of dissection. Skeleton: Degenerative changes of the cervical spine, greatest at C3-C4 where there is retrolisthesis, disc osteophyte complex, and uncovertebral  hypertrophy. Resulting severe canal and foraminal stenosis. Chronic thoracic compression fractures. Other neck: No mass or adenopathy. Upper chest: Advanced emphysema. Review of the MIP images confirms the above findings CTA HEAD Anterior circulation: Intracranial internal carotid arteries are patent with mixed but primarily calcified plaque no significant stenosis. There is a 1 mm inferiorly directed outpouching of the distal supraclinoid right ICA. Anterior and middle cerebral arteries are patent. Posterior circulation: Intracranial vertebral arteries, basilar artery, and posterior cerebral arteries are patent. Major cerebellar artery origins are patent. Venous sinuses: Patent as allowed by contrast bolus timing. Review of the MIP images confirms the above findings IMPRESSION: No acute intracranial hemorrhage or definite acute infarction. Suspected age-indeterminate, potentially acute infarct of the right caudate body and adjacent white matter. No large vessel occlusion. Plaque at the ICA origins causes less than 50% stenosis. 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 Degenerative changes at C3-C4 with severe canal and foraminal stenosis. Initial results were called by telephone at the time of interpretation on 01/06/2021 at 1:45 pm to provider [facility] , who verbally acknowledged these results.  Electronically Signed   By: Guadlupe Spanish M.D.   On: 01/06/2021 13:57   Assessment: 75 y.o. male with history of prior stroke, essential hypertension, COPD who present to the hospital with slurred speech and left facial droop the prior night.  Stroke Risk Factors - hypertension and smoking  Plan: 1. HgbA1c, fasting lipid panel 2. MRI, MRA  of the brain without contrast 3. PT consult, OT consult, Speech consult 4. Echocardiogram 5. Carotid dopplers 6. Prophylactic therapy-aspirin, Lipitor 7. Risk factor modification 7. Telemetry monitoring  #1.  Acute brain ischemic stroke. Patient has been seen by neurology, start stroke work-up.  Continue aspirin, also check lipid panel and TSH.  Obtain brain MRI, echocardiogram, carotid ultrasound.  Also check HbA1c.  2.  Severe protein calorie malnutrition. Patient has been having poor appetite.  No nausea vomiting.  Will need outpatient work-up with a family doctor.  3.  COPD. Patient has baseline short of breath with exertion, continue home medicines.  4.  Pure essential hypertension. Continue home medicines.   Stroke education, treatment, and current plan discussed with patient.  Christopher Macdonald 01/06/2021, 3:51 PM

## 2021-01-06 NOTE — ED Provider Notes (Signed)
[facility] Emergency Department Provider Note  ____________________________________________   Event Date/Time   First MD Initiated Contact with Patient 01/06/21 1315     (approximate)  I have reviewed the triage vital signs and the nursing notes.   HISTORY  Chief Complaint Facial Droop and Aphasia    HPI Christopher Macdonald is a 75 y.o. male with COPD, depression, hypertension who comes in for concern for stroke.  Patient woke up this morning with strokelike symptoms.  Stated that he had facial droop on the left as well difficulty speaking.  He states that he went to bed yesterday and he felt fine but is not sure what time that was.  He states that he knows that he was fine around lunchtime.  He states that the symptoms have been constant, severe, nothing makes it better, nothing makes them worse.  He reports having a prior stroke but not having any symptoms from it.  Does report some chronic shortness of breath but nothing that is changed recently.          Past Medical History:  Diagnosis Date  . COPD (chronic obstructive pulmonary disease) (HCC)   . Depression   . Elevated PSA   . Glaucoma   . Hepatitis   . HTN (hypertension)   . Tobacco abuse     Patient Active Problem List   Diagnosis Date Noted  . Protein-calorie malnutrition, severe 12/13/2018  . Pneumonia 12/12/2018  . Atherosclerosis of native arteries of extremity with intermittent claudication (HCC) 08/26/2017  . Leg pain 08/26/2017  . COPD (chronic obstructive pulmonary disease) (HCC) 08/26/2017  . Essential hypertension 08/26/2017    Past Surgical History:  Procedure Laterality Date  . cataract sugery Right   . GLAUCOMA SURGERY  2015  . HERNIA REPAIR  2014    Prior to Admission medications   Medication Sig Start Date End Date Taking? Authorizing Provider  albuterol (PROVENTIL HFA;VENTOLIN HFA) 108 (90 Base) MCG/ACT inhaler Inhale 2 puffs into the lungs 2 (two) times daily.   06/22/17 12/07/20  [provider]  aspirin EC 81 MG tablet Take 81 mg by mouth daily.     [provider]  brimonidine (ALPHAGAN) 0.2 % ophthalmic solution Place 1 drop into the right eye daily.  06/20/17   [provider]  dorzolamide-timolol (COSOPT) 22.3-6.8 MG/ML ophthalmic solution Place 1 drop into the right eye daily.  11/12/18   [provider]  losartan (COZAAR) 50 MG tablet Take 50 mg by mouth daily.  06/29/17 12/07/20  [provider]  LUMIGAN 0.01 % SOLN Place 1 drop into both eyes daily. 11/10/18   [provider]  promethazine-dextromethorphan (PROMETHAZINE-DM) 6.25-15 MG/5ML syrup Take 5 mLs by mouth 4 (four) times daily as needed. 12/07/20   Becky Augusta, NP  sildenafil (REVATIO) 20 MG tablet Take 1 tablet (20 mg total) by mouth as needed. Take 1-5 tabs as needed prior to intercourse 05/19/20   Vanna Scotland, MD  [facility] HANDIHALER 18 MCG inhalation capsule Place 18 mcg into inhaler and inhale daily.  01/03/18   [provider]  timolol (TIMOPTIC) 0.5 % ophthalmic solution  07/07/17   [provider]  atorvastatin (LIPITOR) 40 MG tablet Take 40 mg by mouth daily. 03/27/20 12/07/20  [provider]  gabapentin (NEURONTIN) 100 MG capsule Take 500 mg by mouth daily.  01/03/18 12/07/20  [provider]  potassium chloride SA (K-DUR,KLOR-CON) 20 MEQ tablet Take 2 tablets (40 mEq total) by mouth daily. 12/15/18  12/07/20  Auburn Bilberry, MD    Allergies Wasp venom  Family History  Problem Relation Age of Onset  . CAD Mother   . Stroke Father   . Prostate cancer Neg Hx   . Kidney cancer Neg Hx   . Bladder Cancer Neg Hx     Social History Social History   Tobacco Use  . Smoking status: Current Every Day Smoker    Packs/day: 1.00    Years: 63.00    Pack years: 63.00    Types: Cigarettes  . Smokeless tobacco: Former Neurosurgeon    Types: Engineer, drilling  . Vaping Use: Never used  Substance Use Topics  .  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  . Drug use: Yes    Types: Marijuana      Review of Systems Constitutional: No fever/chills Eyes: No visual changes. ENT: No sore throat. Positive aphasia, facial droop Cardiovascular: Denies chest pain. Respiratory: Denies shortness of breath. Gastrointestinal: No abdominal pain.  No nausea, no vomiting.  No diarrhea.  No constipation. Genitourinary: Negative for dysuria. Musculoskeletal: Negative for back pain. Skin: Negative for rash. Neurological: Aphasia, facial droop All other ROS negative ____________________________________________   PHYSICAL EXAM:  VITAL SIGNS: ED Triage Vitals [01/06/21 1300]  Enc Vitals Group     BP (!) 187/96     Pulse Rate 68     Resp 20     Temp      Temp Source Oral     SpO2 97 %     Weight 115 lb (52.2 kg)     Height 6' (1.829 m)     Head Circumference      Peak Flow      Pain Score 0     Pain Loc      Pain Edu?      Excl. in GC?     Constitutional: Alert and oriented. Well appearing and in no acute distress. Eyes: Conjunctivae are normal. EOMI. Head: Atraumatic. Nose: No congestion/rhinnorhea. Mouth/Throat: Mucous membranes are moist.   Neck: No stridor. Trachea Midline. FROM Cardiovascular: Normal rate, regular rhythm. Grossly normal heart sounds.  Good peripheral circulation. Respiratory: Normal respiratory effort.  No retractions. Lungs CTAB. Gastrointestinal: Soft and nontender. No distention. No abdominal bruits.  Musculoskeletal: No lower extremity tenderness nor edema.  No joint effusions. Neurologic: Mild aphasia with left-sided facial droop, NIH stroke scale of 2 Skin:  Skin is warm, dry and intact. No rash noted. Psychiatric: Mood and affect are normal. Speech and behavior are normal. GU: Deferred   ____________________________________________   LABS (all labs ordered are listed, but only abnormal results are  displayed)  Labs Reviewed  CBC - Abnormal; Notable for the following components:      Result Value   Platelets 138 (*)    All other components within normal limits  COMPREHENSIVE METABOLIC PANEL - Abnormal; Notable for the following components:   Glucose, Bld 104 (*)    ALT 82 (*)    All other components within normal limits  CBG MONITORING, ED - Abnormal; Notable for the following components:   Glucose-Capillary 101 (*)    All other components within normal limits  RESP PANEL BY RT-PCR (FLU A&B, COVID) ARPGX2  PROTIME-INR  APTT  DIFFERENTIAL   ____________________________________________   ED ECG REPORT I, Concha Se, the attending physician, personally viewed and interpreted this ECG.  Normal sinus rate of 66, mild ST  elevation in V3 that looks more like early repole given there is no reciprocal changes, does have a T wave version aVL, normal intervals ____________________________________________  RADIOLOGY   Official radiology report(s): CT ANGIO HEAD W OR WO CONTRAST  Result Date: 01/06/2021 CLINICAL DATA:  Facial droop and abnormal speech EXAM: CT ANGIOGRAPHY HEAD AND NECK TECHNIQUE: Multidetector CT imaging of the head and neck was performed using the standard protocol during bolus administration of intravenous contrast. Multiplanar CT image reconstructions and MIPs were obtained to evaluate the vascular anatomy. Carotid stenosis measurements (when applicable) are obtained utilizing NASCET criteria, using the distal internal carotid diameter as the denominator. CONTRAST:  14mL OMNIPAQUE IOHEXOL 350 MG/ML SOLN COMPARISON:  None. FINDINGS: CT HEAD Brain: There is no acute intracranial hemorrhage or mass effect. 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 Suspected age-indeterminate small vessel infarct of the right caudate body and adjacent white matter. There is no  extra-axial fluid collection. Prominence of the ventricles and sulci reflects mild parenchymal volume loss. There is mild superior cerebellar volume loss. Vascular: No hyperdense vessel. Skull: Calvarium is unremarkable. Sinuses/Orbits: No acute finding. Other: None. Review of the MIP images confirms the above findings CTA NECK Aortic arch: Mixed plaque along the aortic arch and at the patent great vessel origins. Right carotid system: Patent. Mixed but primarily calcified plaque at the proximal ICA causing less than 50% stenosis. Left carotid system: Patent. Mixed but primarily calcified plaque at the proximal ICA causing less than 50% stenosis. Vertebral arteries: Patent. Left vertebral artery is slightly dominant. No measurable stenosis or evidence of dissection. Skeleton: Degenerative changes of the cervical spine, greatest at C3-C4 where there is retrolisthesis, disc osteophyte complex, and uncovertebral hypertrophy. Resulting severe canal and foraminal stenosis. Chronic thoracic compression fractures. Other neck: No mass or adenopathy. Upper chest: Advanced emphysema. Review of the MIP images confirms the above findings CTA HEAD Anterior circulation: Intracranial internal carotid arteries are patent with mixed but primarily calcified plaque no significant stenosis. There is a 1 mm inferiorly directed outpouching of the distal supraclinoid right ICA. Anterior and middle cerebral arteries are patent. Posterior circulation: Intracranial vertebral arteries, basilar artery, and posterior cerebral arteries are patent. Major cerebellar artery origins are patent. Venous sinuses: Patent as allowed by contrast bolus timing. Review of the MIP images confirms the above findings IMPRESSION: No acute intracranial hemorrhage or definite acute infarction. Suspected age-indeterminate, potentially acute infarct of the right caudate body and adjacent white matter. No large vessel occlusion. Plaque at the ICA origins causes less  than 50% stenosis. 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 Degenerative changes at C3-C4 with severe canal and foraminal stenosis. Initial results were called by telephone at the time of interpretation on 01/06/2021 at 1:45 pm to provider [facility] , who verbally acknowledged these results. Electronically Signed   By: Guadlupe Spanish M.D.   On: 01/06/2021 13:57   CT Code Stroke CTA Neck W/WO contrast  Result Date: 01/06/2021 CLINICAL DATA:  Facial droop and abnormal speech EXAM: CT ANGIOGRAPHY HEAD AND NECK TECHNIQUE: Multidetector CT imaging of the head and neck was performed using the standard protocol during bolus administration of intravenous contrast. Multiplanar CT image reconstructions and MIPs were obtained to evaluate the vascular anatomy. Carotid stenosis measurements (when applicable) are obtained utilizing NASCET criteria, using the distal internal carotid diameter as the denominator. CONTRAST:  31mL OMNIPAQUE IOHEXOL 350 MG/ML SOLN COMPARISON:  None. FINDINGS: CT HEAD Brain: There is no acute intracranial hemorrhage or mass effect. 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 Suspected age-indeterminate small vessel infarct of the right caudate body and adjacent white matter. There is no extra-axial fluid collection. Prominence of the ventricles and sulci reflects mild parenchymal volume loss. There is mild superior cerebellar volume loss. Vascular: No hyperdense vessel. Skull: Calvarium is unremarkable. Sinuses/Orbits: No acute finding. Other: None. Review of the MIP images confirms the above findings CTA NECK Aortic arch: Mixed plaque along the aortic arch and at the patent great vessel origins. Right carotid system: Patent. Mixed but primarily calcified plaque at the proximal ICA causing less than 50% stenosis. Left carotid system: Patent. Mixed but primarily calcified plaque at the  proximal ICA causing less than 50% stenosis. Vertebral arteries: Patent. Left vertebral artery is slightly dominant. No measurable stenosis or evidence of dissection. Skeleton: Degenerative changes of the cervical spine, greatest at C3-C4 where there is retrolisthesis, disc osteophyte complex, and uncovertebral hypertrophy. Resulting severe canal and foraminal stenosis. Chronic thoracic compression fractures. Other neck: No mass or adenopathy. Upper chest: Advanced emphysema. Review of the MIP images confirms the above findings CTA HEAD Anterior circulation: Intracranial internal carotid arteries are patent with mixed but primarily calcified plaque no significant stenosis. There is a 1 mm inferiorly directed outpouching of the distal supraclinoid right ICA. Anterior and middle cerebral arteries are patent. Posterior circulation: Intracranial vertebral arteries, basilar artery, and posterior cerebral arteries are patent. Major cerebellar artery origins are patent. Venous sinuses: Patent as allowed by contrast bolus timing. Review of the MIP images confirms the above findings IMPRESSION: No acute intracranial hemorrhage or definite acute infarction. Suspected age-indeterminate, potentially acute infarct of the right caudate body and adjacent white matter. No large vessel occlusion. Plaque at the ICA origins causes less than 50% stenosis. 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 Degenerative changes at C3-C4 with severe canal and foraminal stenosis. Initial results were called by telephone at the time of interpretation on 01/06/2021 at 1:45 pm to provider [facility] , who verbally acknowledged these results. Electronically Signed   By: Guadlupe Spanish M.D.   On: 01/06/2021 13:57    ____________________________________________   PROCEDURES  Procedure(s) performed (including Critical Care):  .1-3 Lead EKG Interpretation Performed by: Concha Se, MD Authorized by: Concha Se, MD      Interpretation: normal     ECG rate:  70s    ECG rate assessment: normal     Rhythm: sinus rhythm     Ectopy: none     Conduction: normal       ____________________________________________   INITIAL IMPRESSION / ASSESSMENT AND PLAN / ED COURSE  Christopher Macdonald was evaluated in Emergency Department on 01/06/2021 for the symptoms described in the history of present illness. He was evaluated in the context of the global COVID-19 pandemic, which necessitated consideration that the patient might be at risk for infection with the SARS-CoV-2 virus that causes COVID-19. Institutional protocols and algorithms that pertain to the evaluation of patients at risk for COVID-19 are in a state of rapid change based on information released by regulatory bodies including the CDC and federal and state organizations. These policies and algorithms were followed during the patient's care in the ED.    Patient is a 75 year old who comes in with aphasia and facial droop. Patient was seen back in  triage and stroke code was called due to my concern of LVO. Also consider ischemic stroke versus hemorrhage. His EKG did not show any evidence of atrial fibrillation but he did have a little ST elevation 1-lead but there is no reciprocal changes and he denies any chest pain. I have low suspicion for ACS at this time. Denies any other shortness of breath or abdominal pain to suggest other issues. Glucose was normal no hypoglycemia We will keep patient on the cardiac monitor to evaluate for atrial fibrillation.  CT scan without LVO or hemorrhage. Concern for new acute stroke. Discussed with Dr. Otelia LimesLindzen from neurology who recommended admission for stroke work-up    ____________________________________________   FINAL CLINICAL IMPRESSION(S) / ED DIAGNOSES   Final diagnoses:  Cerebrovascular accident (CVA), unspecified mechanism (HCC)      MEDICATIONS GIVEN DURING THIS VISIT:  Medications  iohexol (OMNIPAQUE) 350  MG/ML injection 75 mL (75 mLs Intravenous Contrast Given 01/06/21 1318)     ED Discharge Orders    None       Note:  This document was prepared using Dragon voice recognition software and may include unintentional dictation errors.   Concha SeFunke, Samary Shatz E, MD 01/06/21 [phone]

## 2021-01-06 NOTE — ED Notes (Signed)
Helped pt OOB to toilet for BM. Pt walked with steady gait (weakness has improved since first presented to ED). Pt back in bed.

## 2021-01-06 NOTE — ED Triage Notes (Signed)
EDP Funke in triage to assess patient at this time.

## 2021-01-06 NOTE — Code Documentation (Signed)
Pt arrives via POV with complaints of facial droop, dizziness, and dysarthia, states he went to bed 2/22 2100 WNL, states he woke up at 0700 this AM with facial droop and slurred speech, code stroke activated by Dr. Fuller Plan for possible LVO, CT and CTA completed, pt taken to room 3, No tPA due to LKW > 4.5 hrs, Dr. Otelia Limes at bedside, NIHSS 2 for facial droop and dysarthia, Report off to [facility] for Q2 neuro checks and vital signs

## 2021-01-06 NOTE — Plan of Care (Signed)

## 2021-01-07 ENCOUNTER — Observation Stay (HOSPITAL_BASED_OUTPATIENT_CLINIC_OR_DEPARTMENT_OTHER)
Admit: 2021-01-07 | Discharge: 2021-01-07 | Disposition: A | Discharge status: Home / Self Care | Payer: Medicare HMO | Attending: Internal Medicine | Admitting: Internal Medicine

## 2021-01-07 ENCOUNTER — Inpatient Hospital Stay: Payer: Medicare HMO

## 2021-01-07 DIAGNOSIS — E43 Unspecified severe protein-calorie malnutrition: Secondary | ICD-10-CM | POA: Diagnosis not present

## 2021-01-07 DIAGNOSIS — J439 Emphysema, unspecified: Secondary | ICD-10-CM | POA: Diagnosis not present

## 2021-01-07 DIAGNOSIS — M4802 Spinal stenosis, cervical region: Secondary | ICD-10-CM | POA: Diagnosis not present

## 2021-01-07 DIAGNOSIS — I6389 Other cerebral infarction: Secondary | ICD-10-CM

## 2021-01-07 DIAGNOSIS — I6350 Cerebral infarction due to unspecified occlusion or stenosis of unspecified cerebral artery: Secondary | ICD-10-CM | POA: Diagnosis not present

## 2021-01-07 LAB — ECHOCARDIOGRAM COMPLETE
Height: 72 in
S' Lateral: 1.87 cm
Weight: 1840 [oz_av]

## 2021-01-07 LAB — LIPID PANEL
Cholesterol: 121 mg/dL (ref 0–200)
HDL: 40 mg/dL — ABNORMAL LOW (ref >40)
LDL Cholesterol: 63 mg/dL (ref 0–99)
Total CHOL/HDL Ratio: 3 RATIO
Triglycerides: 88 mg/dL (ref <150)
VLDL: 18 mg/dL (ref 0–40)

## 2021-01-07 LAB — HEMOGLOBIN A1C
Hgb A1c MFr Bld: 6 % — ABNORMAL HIGH (ref 4.8–5.6)
Mean Plasma Glucose: 125.5 mg/dL

## 2021-01-07 MED ORDER — MELATONIN 5 MG PO TABS
5.0000 mg | ORAL_TABLET | Freq: Every day | ORAL | Status: DC
  Administered 2021-01-08: 5 mg via ORAL
  Filled 2021-01-07 (×2): qty 1

## 2021-01-07 MED ORDER — GADOBUTROL 1 MMOL/ML IV SOLN
5.0000 mL | Freq: Once | INTRAVENOUS | Status: AC | PRN
  Administered 2021-01-07: 5 mL via INTRAVENOUS

## 2021-01-07 MED ORDER — CLOPIDOGREL BISULFATE 75 MG PO TABS
75.0000 mg | ORAL_TABLET | Freq: Every day | ORAL | Status: DC
  Administered 2021-01-07 – 2021-01-08 (×2): 75 mg via ORAL
  Filled 2021-01-07 (×2): qty 1

## 2021-01-07 NOTE — Evaluation (Signed)
Occupational Therapy Evaluation Patient Details Name: Christopher Macdonald MRN: [id number] DOB: 11-01-1946 Today's Date: 01/07/2021    History of Present Illness Per MD notes: Pt is a 75 y.o. male with history of COPD, CVA, depression, essential hypertension, hepatitis, tobacco abuse who presented to the hospital with a slurred speech and left facial droop.  MD assessment includes: Acute ischemic stroke and severe protein calorie malnutrition.   Clinical Impression   Pt seen for OT evaluation this date in setting of acute hospitalziation d/t stroke. Pt reports that his symptoms appear to be resolving this date, and on assessment, pt's facial droop and speech appear to be returning to his baseline, which pt confirms as well. Pt reports being INDEP with self care and ADL mobility at baseline, but endorses recent falls. Pt demos ability to perform ADLs and ADL mobility this date at MOD I level which is close to his fxl baseline, some slightly decreased activity tolerance, but overall, pt able to safely and reasonably care for himself. Pt's ROM and FMC are equal bilaterally and strength is WFL for his age. Anticipate pt will not require OT f/u outside of acute stay, but OT does complete education with pt re: importance of OOB activity, monitoring BP at home, improving diet/exercise and generally increasing activity levels for generally improving health, but also, stroke prevention. Do not anticipate any further acute OT needs at this time.     Follow Up Recommendations  No OT follow up    Equipment Recommendations  None recommended by OT    Recommendations for Other Services       Precautions / Restrictions Precautions Precautions: None      Mobility Bed Mobility Overal bed mobility: Independent             General bed mobility comments: Good speed and effort with bed mobility tasks    Transfers Overall transfer level: Modified independent Equipment used: None Transfers: Sit  to/from Stand Sit to Stand: Supervision;Modified independent (Device/Increase time)         General transfer comment: SUPV initially, good control and no assist needed, MOD I ultimately-only requiring increased time    Balance Overall balance assessment: Needs assistance   Sitting balance-Leahy Scale: Normal     Standing balance support: No upper extremity supported Standing balance-Leahy Scale: Good                             ADL either performed or assessed with clinical judgement   ADL Overall ADL's : Modified independent;At baseline                                       General ADL Comments: increased time, but INDEP     Vision Baseline Vision/History: Glaucoma (poor vision to L eye d/t glaucoma at baseline) Patient Visual Report: No change from baseline (R eye is more fxl at baseline and pt demos good tracking adn peripheral vision. Pt reports prescription for night driving glasses.)       Perception     Praxis      Pertinent Vitals/Pain Pain Assessment: No/denies pain     Hand Dominance Right   Extremity/Trunk Assessment Upper Extremity Assessment Upper Extremity Assessment: Overall WFL for tasks assessed [facility] appears to be intact and equal bilaterally as well as strength. Pt's strength is grossly 4-/5 with potential for improvement, but overall  functional for daily tasks.)   Lower Extremity Assessment Lower Extremity Assessment: Overall WFL for tasks assessed       Communication Communication Communication: No difficulties   Cognition Arousal/Alertness: Awake/alert Behavior During Therapy: WFL for tasks assessed/performed Overall Cognitive Status: Within Functional Limits for tasks assessed                                     General Comments       Exercises Other Exercises Other Exercises: Ed re: role of OT in acute setting, BP monitoring, relaxation techniques/keeping stress low, increasing gentle  activity levels for general prolonged health including maintaining BP for stroke prevention   Shoulder Instructions      Home Living Family/patient expects to be discharged to:: Private residence Living Arrangements: Non-relatives/Friends Available Help at Discharge: Friend(s);Available 24 hours/day Type of Home: Mobile home Home Access: Stairs to enter Entrance Stairs-Number of Steps: 5 Entrance Stairs-Rails: Right;Left;Can reach both Home Layout: One level     Bathroom Shower/Tub: Chief Strategy Officer: Standard         Additional Comments: Owns a walking stick      Prior Functioning/Environment Level of Independence: Independent        Comments: Ind amb limited community distances without an AD, 3 falls in the last 6 months secondary to LOB, Ind with ADLs        OT Problem List: Decreased strength;Decreased activity tolerance      OT Treatment/Interventions:      OT Goals(Current goals can be found in the care plan section) Acute Rehab OT Goals Patient Stated Goal: To get back home OT Goal Formulation: All assessment and education complete, DC therapy  OT Frequency:     Barriers to D/C:            Co-evaluation              AM-PAC OT "6 Clicks" Daily Activity     Outcome Measure Help from another person eating meals?: None Help from another person taking care of personal grooming?: None Help from another person toileting, which includes using toliet, bedpan, or urinal?: None Help from another person bathing (including washing, rinsing, drying)?: None Help from another person to put on and taking off regular upper body clothing?: None Help from another person to put on and taking off regular lower body clothing?: None 6 Click Score: 24   End of Session Nurse Communication: Mobility status  Activity Tolerance: Patient tolerated treatment well Patient left: in bed  OT Visit Diagnosis: Muscle weakness (generalized) (M62.81)                 Time: 9811-9147 OT Time Calculation (min): 11 min Charges:  OT General Charges $OT Visit: 1 Visit OT Evaluation $OT Eval Low Complexity: 1 Low  Rejeana Brock, MS, OTR/L ascom [phone] 01/07/21, 5:18 PM

## 2021-01-07 NOTE — Care Management Obs Status (Signed)
MEDICARE OBSERVATION STATUS NOTIFICATION   Patient Details  Name: Christopher Macdonald MRN: [id number] Date of Birth: Sep 25, 1946   Medicare Observation Status Notification Given:  Yes  Reviewed with patient via phone.  Christopher Morikawa E Gisela Lea, LCSW 01/07/2021, 3:40 PM

## 2021-01-07 NOTE — Progress Notes (Signed)
PROGRESS NOTE    Christopher Macdonald  ZOX:[id number] DOB: [birth year] DOA: 01/06/2021 PCP: Rayetta Humphrey, MD   Chief complaint.  Slurred speech. Brief Narrative:  Christopher Macdonald is an 75 y.o. male with history of COPD, depression, essential hypertension, hepatitis, tobacco abuse who present to the hospital with a slurred speech and left facial droop. MRI showed acute infarction affecting the right body of the caudate.  CT angiogram of the head and neck did not show significant occlusion, showed a 1 mm brain aneurysm.  Also showed severe C3-C4 cervical stenosis. Patient was treated with aspirin, Plavix as well as statin. Echocardiogram showed ejection fraction normal, mild pulmonary hypertension.   Assessment & Plan:   Active Problems:   CVA (cerebral vascular accident) (HCC)  #1.  Acute CVA. Continue aspirin, Plavix. Continue telemetry to rule out atrial fibrillation. Patient condition seem to be improving.  2.  Cervical spine stenosis. Pending MRI of the cervical spine with contrast.  3.  Severe protein calorie malnutrition. Continue supplement with protein.  4.  COPD. Stable    DVT prophylaxis: Lovenox Code Status: Full Family Communication:  Disposition Plan:  .   Status is: Inpatient  Remains inpatient appropriate because:Ongoing diagnostic testing needed not appropriate for outpatient work up   Dispo: The patient is from: Home              Anticipated d/c is to: Home              Patient currently is not medically stable to d/c.   Difficult to place patient No        No intake/output data recorded. No intake/output data recorded.     Consultants:   Neurology  Procedures: None  Antimicrobials: None  Subjective: Patient slurred speech seems to be better, facial droop improved.  No significant weakness today. Denies any short of breath or cough. No abdominal pain or nausea vomiting. No fever or chills.  Objective: Vitals:   01/07/21  0500 01/07/21 0700 01/07/21 1100 01/07/21 1207  BP: (!) 158/88 (!) 167/95 129/73 129/73  Pulse: 63 75 70 70  Resp: Temp: 97.7 F (36.5 C) 97.8 F (36.6 C) 97.8 F (36.6 C)   TempSrc: Oral Oral Oral   SpO2: 100% 100% 98% 98%  Weight:      Height:       No intake or output data in the 24 hours ending 01/07/21 1544 Filed Weights   01/06/21 1300  Weight: 52.2 kg    Examination:  General exam: Appears calm and comfortable, appear malnourished Respiratory system: Clear to auscultation. Respiratory effort normal. Cardiovascular system: S1 & S2 heard, RRR. No JVD, murmurs, rubs, gallops or clicks. No pedal edema. Gastrointestinal system: Abdomen is nondistended, soft and nontender. No organomegaly or masses felt. Normal bowel sounds heard. Central nervous system: Alert and oriented. No focal neurological deficits. Extremities: Symmetric 5 x 5 power. Skin: No rashes, lesions or ulcers Psychiatry: Judgement and insight appear normal. Mood & affect appropriate.     Data Reviewed: I have personally reviewed following labs and imaging studies  CBC: Recent Labs  Lab 01/06/21 1303  WBC 7.0  NEUTROABS 3.6  HGB 13.9  HCT 41.5  MCV 97.2  PLT 138*   Basic Metabolic Panel: Recent Labs  Lab 01/06/21 1303  NA 141  K 3.5  CL 105  CO2 28  GLUCOSE 104*  BUN 15  CREATININE 0.86  CALCIUM 9.1   GFR: Estimated Creatinine Clearance:  55.6 mL/min (by C-G formula based on SCr of 0.86 mg/dL). Liver Function Tests: Recent Labs  Lab 01/06/21 1303  AST 39  ALT 82*  ALKPHOS 97  BILITOT 1.2  PROT 7.1  ALBUMIN 3.7   No results for input(s): LIPASE, AMYLASE in the last 168 hours. No results for input(s): AMMONIA in the last 168 hours. Coagulation Profile: Recent Labs  Lab 01/06/21 1303  INR 1.0   Cardiac Enzymes: No results for input(s): CKTOTAL, CKMB, CKMBINDEX, TROPONINI in the last 168 hours. BNP (last 3 results) No results for input(s): PROBNP in the last 8760  hours. HbA1C: Recent Labs    01/07/21 0428  HGBA1C 6.0*   CBG: Recent Labs  Lab 01/06/21 1305  GLUCAP 101*   Lipid Profile: Recent Labs    01/07/21 0428  CHOL 121  HDL 40*  LDLCALC 63  TRIG 88  CHOLHDL 3.0   Thyroid Function Tests: No results for input(s): TSH, T4TOTAL, FREET4, T3FREE, THYROIDAB in the last 72 hours. Anemia Panel: No results for input(s): VITAMINB12, FOLATE, FERRITIN, TIBC, IRON, RETICCTPCT in the last 72 hours. Sepsis Labs: No results for input(s): PROCALCITON, LATICACIDVEN in the last 168 hours.  Recent Results (from the past 240 hour(s))  Resp Panel by RT-PCR (Flu A&B, Covid) Nasopharyngeal Swab     Status: None   Collection Time: 01/06/21  1:51 PM   Specimen: Nasopharyngeal Swab; Nasopharyngeal(NP) swabs in vial transport medium  Result Value Ref Range Status   SARS Coronavirus 2 by RT PCR NEGATIVE NEGATIVE Final    Comment: (NOTE) SARS-CoV-2 target nucleic acids are NOT DETECTED.  The SARS-CoV-2 RNA is generally detectable in upper respiratory specimens during the acute phase of infection. The lowest concentration of SARS-CoV-2 viral copies this assay can detect is 138 copies/mL. A negative result does not preclude SARS-Cov-2 infection and should not be used as the sole basis for treatment or other patient management decisions. A negative result may occur with  improper specimen collection/handling, submission of specimen other than nasopharyngeal swab, presence of viral mutation(s) within the areas targeted by this assay, and inadequate number of viral copies(<138 copies/mL). A negative result must be combined with clinical observations, patient history, and epidemiological information. The expected result is Negative.  Fact Sheet for Patients:  BloggerCourse.com  Fact Sheet for Healthcare Providers:  SeriousBroker.it  This test is no t yet approved or cleared by the [place] FDA and   has been authorized for detection and/or diagnosis of SARS-CoV-2 by FDA under an Emergency Use Authorization (EUA). This EUA will remain  in effect (meaning this test can be used) for the duration of the COVID-19 declaration under Section 564(b)(1) of the Act, 21 U.S.C.section 360bbb-3(b)(1), unless the authorization is terminated  or revoked sooner.       Influenza A by PCR NEGATIVE NEGATIVE Final   Influenza B by PCR NEGATIVE NEGATIVE Final    Comment: (NOTE) The Xpert Xpress SARS-CoV-2/FLU/RSV plus assay is intended as an aid in the diagnosis of influenza from Nasopharyngeal swab specimens and should not be used as a sole basis for treatment. Nasal washings and aspirates are unacceptable for Xpert Xpress SARS-CoV-2/FLU/RSV testing.  Fact Sheet for Patients: BloggerCourse.com  Fact Sheet for Healthcare Providers: SeriousBroker.it  This test is not yet approved or cleared by the [place] FDA and has been authorized for detection and/or diagnosis of SARS-CoV-2 by FDA under an Emergency Use Authorization (EUA). This EUA will remain in effect (meaning this test can be used) for the duration  of the COVID-19 declaration under Section 564(b)(1) of the Act, 21 U.S.C. section 360bbb-3(b)(1), unless the authorization is terminated or revoked.  Performed at [facility], [address] [place], [place] [address]          Radiology Studies: CT ANGIO HEAD W OR WO CONTRAST  Result Date: 01/06/2021 CLINICAL DATA:  Facial droop and abnormal speech EXAM: CT ANGIOGRAPHY HEAD AND NECK TECHNIQUE: Multidetector CT imaging of the head and neck was performed using the standard protocol during bolus administration of intravenous contrast. Multiplanar CT image reconstructions and MIPs were obtained to evaluate the vascular anatomy. Carotid stenosis measurements (when applicable) are obtained utilizing NASCET criteria, using the  distal internal carotid diameter as the denominator. CONTRAST:  40mL OMNIPAQUE IOHEXOL 350 MG/ML SOLN COMPARISON:  None. FINDINGS: CT HEAD Brain: There is no acute intracranial hemorrhage or mass effect. 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 Suspected age-indeterminate small vessel infarct of the right caudate body and adjacent white matter. There is no extra-axial fluid collection. Prominence of the ventricles and sulci reflects mild parenchymal volume loss. There is mild superior cerebellar volume loss. Vascular: No hyperdense vessel. Skull: Calvarium is unremarkable. Sinuses/Orbits: No acute finding. Other: None. Review of the MIP images confirms the above findings CTA NECK Aortic arch: Mixed plaque along the aortic arch and at the patent great vessel origins. Right carotid system: Patent. Mixed but primarily calcified plaque at the proximal ICA causing less than 50% stenosis. Left carotid system: Patent. Mixed but primarily calcified plaque at the proximal ICA causing less than 50% stenosis. Vertebral arteries: Patent. Left vertebral artery is slightly dominant. No measurable stenosis or evidence of dissection. Skeleton: Degenerative changes of the cervical spine, greatest at C3-C4 where there is retrolisthesis, disc osteophyte complex, and uncovertebral hypertrophy. Resulting severe canal and foraminal stenosis. Chronic thoracic compression fractures. Other neck: No mass or adenopathy. Upper chest: Advanced emphysema. Review of the MIP images confirms the above findings CTA HEAD Anterior circulation: Intracranial internal carotid arteries are patent with mixed but primarily calcified plaque no significant stenosis. There is a 1 mm inferiorly directed outpouching of the distal supraclinoid right ICA. Anterior and middle cerebral arteries are patent. Posterior circulation: Intracranial vertebral arteries,  basilar artery, and posterior cerebral arteries are patent. Major cerebellar artery origins are patent. Venous sinuses: Patent as allowed by contrast bolus timing. Review of the MIP images confirms the above findings IMPRESSION: No acute intracranial hemorrhage or definite acute infarction. Suspected age-indeterminate, potentially acute infarct of the right caudate body and adjacent white matter. No large vessel occlusion. Plaque at the ICA origins causes less than 50% stenosis. 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 Degenerative changes at C3-C4 with severe canal and foraminal stenosis. Initial results were called by telephone at the time of interpretation on 01/06/2021 at 1:45 pm to provider [facility] , who verbally acknowledged these results. Electronically Signed   By: Guadlupe Spanish M.D.   On: 01/06/2021 13:57   CT Code Stroke CTA Neck W/WO contrast  Result Date: 01/06/2021 CLINICAL DATA:  Facial droop and abnormal speech EXAM: CT ANGIOGRAPHY HEAD AND NECK TECHNIQUE: Multidetector CT imaging of the head and neck was performed using the standard protocol during bolus administration of intravenous contrast. Multiplanar CT image reconstructions and MIPs were obtained to evaluate the vascular anatomy. Carotid stenosis measurements (when applicable) are obtained utilizing NASCET criteria, using the distal internal carotid  diameter as the denominator. CONTRAST:  75mL OMNIPAQUE IOHEXOL 350 MG/ML SOLN COMPARISON:  None. FINDINGS: CT HEAD Brain: There is no acute intracranial hemorrhage or mass effect. 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 Suspected age-indeterminate small vessel infarct of the right caudate body and adjacent white matter. There is no extra-axial fluid collection. Prominence of the ventricles and sulci reflects mild parenchymal volume loss. There is mild superior cerebellar volume  loss. Vascular: No hyperdense vessel. Skull: Calvarium is unremarkable. Sinuses/Orbits: No acute finding. Other: None. Review of the MIP images confirms the above findings CTA NECK Aortic arch: Mixed plaque along the aortic arch and at the patent great vessel origins. Right carotid system: Patent. Mixed but primarily calcified plaque at the proximal ICA causing less than 50% stenosis. Left carotid system: Patent. Mixed but primarily calcified plaque at the proximal ICA causing less than 50% stenosis. Vertebral arteries: Patent. Left vertebral artery is slightly dominant. No measurable stenosis or evidence of dissection. Skeleton: Degenerative changes of the cervical spine, greatest at C3-C4 where there is retrolisthesis, disc osteophyte complex, and uncovertebral hypertrophy. Resulting severe canal and foraminal stenosis. Chronic thoracic compression fractures. Other neck: No mass or adenopathy. Upper chest: Advanced emphysema. Review of the MIP images confirms the above findings CTA HEAD Anterior circulation: Intracranial internal carotid arteries are patent with mixed but primarily calcified plaque no significant stenosis. There is a 1 mm inferiorly directed outpouching of the distal supraclinoid right ICA. Anterior and middle cerebral arteries are patent. Posterior circulation: Intracranial vertebral arteries, basilar artery, and posterior cerebral arteries are patent. Major cerebellar artery origins are patent. Venous sinuses: Patent as allowed by contrast bolus timing. Review of the MIP images confirms the above findings IMPRESSION: No acute intracranial hemorrhage or definite acute infarction. Suspected age-indeterminate, potentially acute infarct of the right caudate body and adjacent white matter. No large vessel occlusion. Plaque at the ICA origins causes less than 50% stenosis. 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 Degenerative changes at C3-C4 with severe canal and foraminal stenosis.  Initial results were called by telephone at the time of interpretation on 01/06/2021 at 1:45 pm to provider [facility] , who verbally acknowledged these results. Electronically Signed   By: Guadlupe Spanish M.D.   On: 01/06/2021 13:57   MR BRAIN WO CONTRAST  Result Date: 01/06/2021 CLINICAL DATA:  Left facial droop and slurred speech. EXAM: MRI HEAD WITHOUT CONTRAST TECHNIQUE: Multiplanar, multiecho pulse sequences of the brain and surrounding structures were obtained without intravenous contrast. COMPARISON:  CT studies earlier same day. FINDINGS: Brain: Diffusion imaging confirms acute infarction in the right body of the caudate and adjacent white matter tracks. No other acute infarction. The brainstem and cerebellum are normal. Old infarction in the left basal ganglia and radiating white matter tracts. Moderate chronic small-vessel ischemic changes of the hemispheric white matter. No large vessel territory infarction. No mass lesion, hemorrhage, hydrocephalus or extra-axial collection. Old hemosiderin deposition in the left basal ganglia infarction. Vascular: Major vessels at the base of the brain show flow. Skull and upper cervical spine: Negative Sinuses/Orbits: Clear/normal Other: None IMPRESSION: 1. Acute infarction affecting the right body of the caudate and adjacent white matter tracks. 2. Old infarction in the left basal ganglia and radiating white matter tracts. 3. Moderate chronic small-vessel ischemic changes of the hemispheric white matter. Electronically Signed   By: Paulina Fusi M.D.   On: 01/06/2021 18:17  ECHOCARDIOGRAM COMPLETE  Result Date: 01/07/2021    ECHOCARDIOGRAM REPORT   Patient Name:   Christopher Macdonald Thalmann Date of Exam: 01/07/2021 Medical Rec #:  [id number]         Height:       72.0 in Accession #:    [id number]        Weight:       115.0 lb Date of Birth:  [birth year]        BSA:          1.684 m Patient Age:    74 years          BP:           167/95 mmHg Patient Gender: M                  HR:           75 bpm. Exam Location:  [facility] Procedure: 2D Echo, Color Doppler and Cardiac Doppler Indications:     Stroke I63.9  History:         Patient has no prior history of Echocardiogram examinations.                  COPD; Risk Factors:Hypertension.  Sonographer:     Cristela Blue RDCS (AE) Referring Phys:  [id number] Marrion Coy Diagnosing Phys: Julien Nordmann MD  Sonographer Comments: No apical window and no parasternal window. Image acquisition challenging due to COPD, Image acquisition challenging due to patient body habitus and The only view obtainable was subcostal. IMPRESSIONS  1. Left ventricular ejection fraction, by estimation, is 60 to 65%. The left ventricle has normal function. The left ventricle has no regional wall motion abnormalities. Left ventricular diastolic parameters are indeterminate.  2. Right ventricular systolic function is normal. The right ventricular size is normal. There is mildly elevated pulmonary artery systolic pressure. The estimated right ventricular systolic pressure is 36.6 mmHg.  3. Tricuspid valve regurgitation is moderate. FINDINGS  Left Ventricle: Left ventricular ejection fraction, by estimation, is 60 to 65%. The left ventricle has normal function. The left ventricle has no regional wall motion abnormalities. The left ventricular internal cavity size was normal in size. There is  no left ventricular hypertrophy. Left ventricular diastolic parameters are indeterminate. Right Ventricle: The right ventricular size is normal. No increase in right ventricular wall thickness. Right ventricular systolic function is normal. There is mildly elevated pulmonary artery systolic pressure. The tricuspid regurgitant velocity is 2.81  m/s, and with an assumed right atrial pressure of 5 mmHg, the estimated right ventricular systolic pressure is 36.6 mmHg. Left Atrium: Left atrial size was normal in size. Right Atrium: Right atrial size was normal in size. Pericardium: There is no  evidence of pericardial effusion. Mitral Valve: The mitral valve is normal in structure. No evidence of mitral valve regurgitation. No evidence of mitral valve stenosis. Tricuspid Valve: The tricuspid valve is normal in structure. Tricuspid valve regurgitation is moderate . No evidence of tricuspid stenosis. Aortic Valve: The aortic valve is normal in structure. Aortic valve regurgitation is not visualized. Mild aortic valve sclerosis is present, with no evidence of aortic valve stenosis. Pulmonic Valve: The pulmonic valve was normal in structure. Pulmonic valve regurgitation is not visualized. No evidence of pulmonic stenosis. Aorta: The aortic root is normal in size and structure. Venous: The inferior vena cava is normal in size with greater than 50% respiratory variability, suggesting right atrial pressure of 3 mmHg. IAS/Shunts: No atrial level shunt detected by color flow  Doppler.  LEFT VENTRICLE PLAX 2D LVIDd:         2.53 cm LVIDs:         1.87 cm LV PW:         1.94 cm LV IVS:        1.35 cm                        PULMONIC VALVE AORTA                 PV Vmax:        0.65 m/s Ao Root diam: 4.10 cm PV Peak grad:   1.7 mmHg                       RVOT Peak grad: 3 mmHg  TRICUSPID VALVE TR Peak grad:   31.6 mmHg TR Vmax:        281.00 cm/s Julien Nordmannimothy Gollan MD Electronically signed by Julien Nordmannimothy Gollan MD Signature Date/Time: 01/07/2021/3:22:34 PM    Final         Scheduled Meds: . albuterol  2 puff Inhalation BID  . aspirin EC  81 mg Oral Daily  . atorvastatin  40 mg Oral Daily  . brimonidine  1 drop Right Eye Daily  . clopidogrel  75 mg Oral Daily  . dorzolamide-timolol  1 drop Right Eye Daily  . enoxaparin (LOVENOX) injection  40 mg Subcutaneous Q24H  . feeding supplement  237 mL Oral BID BM  . fluticasone furoate-vilanterol  1 puff Inhalation Daily  . latanoprost  1 drop Both Eyes QHS  . losartan  50 mg Oral Daily  . nicotine  21 mg Transdermal Daily  . umeclidinium bromide  1 puff Inhalation  Daily   Continuous Infusions:   LOS: 0 days    Time spent: 28 minutes    Marrion Coyekui Zhang, MD [facility]   To contact the attending provider between 7A-7P or the covering provider during after hours 7P-7A, please log into the web site [email or web address] and access using universal [place] password for that web site. If you do not have the password, please call the hospital operator.  01/07/2021, 3:44 PM

## 2021-01-07 NOTE — Progress Notes (Signed)
Physical Therapy Treatment Patient Details Name: Christopher Macdonald MRN: [id number] DOB: 11-Jan-1946 Today's Date: 01/07/2021    History of Present Illness Per MD notes: Pt is a 75 y.o. male with history of COPD, CVA, depression, essential hypertension, hepatitis, tobacco abuse who presented to the hospital with a slurred speech and left facial droop.  MD assessment includes: Acute ischemic stroke and severe protein calorie malnutrition.    PT Comments    Pt was pleasant and motivated to participate during the session and met all goals this session.  Pt was Ind to Mod Ind with all functional tasks and reported feeling back to baseline.  Pt was steady with transfers, gait, and stair training.  During amb pt presented with no drifting and was able to amb 200' with an RPE of 4-5/10.  Pt was able to achieve 10+ sec on both the LLE and RLE during SLS assessment with no LOB on either extremity.  No additional skilled PT services required at this time. Will complete PT orders at this time but will reassess pt pending a change in status upon receipt of new PT orders.     Follow Up Recommendations  No PT follow up     Equipment Recommendations  None recommended by PT    Recommendations for Other Services       Precautions / Restrictions Precautions Precautions: None Restrictions Weight Bearing Restrictions: No    Mobility  Bed Mobility Overal bed mobility: Independent             General bed mobility comments: Good speed and effort with bed mobility tasks    Transfers Overall transfer level: Independent Equipment used: None Transfers: Sit to/from Stand Sit to Stand: Supervision;Modified independent (Device/Increase time)         General transfer comment: Very good eccentric and concentric control and stability  Ambulation/Gait Ambulation/Gait assistance: Independent Gait Distance (Feet): 200 Feet Assistive device: None   Gait velocity: WNL   General Gait Details:  Good cadence and stability with no drifting including during start/stops, head turns, and fast 180 deg turns   Stairs Stairs: Yes Stairs assistance: Modified independent (Device/Increase time) Stair Management: One rail Right Number of Stairs: 6 General stair comments: Good eccentric and concentric control and stability   Wheelchair Mobility    Modified Rankin (Stroke Patients Only)       Balance Overall balance assessment: Independent   Sitting balance-Leahy Scale: Normal     Standing balance support: No upper extremity supported Standing balance-Leahy Scale: Normal Standing balance comment: Pt steady with both L and R SLS this session with great improvement compared to prior session Single Leg Stance - Right Leg: 10 Single Leg Stance - Left Leg: 10                        Cognition Arousal/Alertness: Awake/alert Behavior During Therapy: WFL for tasks assessed/performed Overall Cognitive Status: Within Functional Limits for tasks assessed                                        Exercises Other Exercises Other Exercises: Extensive pt education provided on principles of activity progression and use of RPE to guide intensity/duration as well as on physiological benefits to activity    General Comments        Pertinent Vitals/Pain Pain Assessment: No/denies pain    Home Living Family/patient expects  to be discharged to:: Private residence Living Arrangements: Non-relatives/Friends Available Help at Discharge: Friend(s);Available 24 hours/day Type of Home: Mobile home Home Access: Stairs to enter Entrance Stairs-Rails: Right;Left;Can reach both Home Layout: One level   Additional Comments: Owns a walking stick    Prior Function Level of Independence: Independent      Comments: Ind amb limited community distances without an AD, 3 falls in the last 6 months secondary to LOB, Ind with ADLs   PT Goals (current goals can now be found in  the care plan section) Acute Rehab PT Goals Patient Stated Goal: To get back home Progress towards PT goals: Goals met and updated - see care plan    Frequency           PT Plan Discharge plan needs to be updated    Co-evaluation              AM-PAC PT "6 Clicks" Mobility   Outcome Measure  Help needed turning from your back to your side while in a flat bed without using bedrails?: None Help needed moving from lying on your back to sitting on the side of a flat bed without using bedrails?: None Help needed moving to and from a bed to a chair (including a wheelchair)?: None Help needed standing up from a chair using your arms (e.g., wheelchair or bedside chair)?: None Help needed to walk in hospital room?: None Help needed climbing 3-5 steps with a railing? : None 6 Click Score: 24    End of Session Equipment Utilized During Treatment: Gait belt Activity Tolerance: Patient tolerated treatment well Patient left: in bed;with call bell/phone within reach Nurse Communication: Mobility status PT Visit Diagnosis: History of falling (Z91.81);Difficulty in walking, not elsewhere classified (R26.2)     Time: 6060-0459 PT Time Calculation (min) (ACUTE ONLY): 23 min  Charges:  $Gait Training: 8-22 mins $Therapeutic Activity: 8-22 mins                     D. Scott Tanner PT, DPT 01/07/21, 5:26 PM

## 2021-01-07 NOTE — Progress Notes (Signed)
MRI brain:  1. Acute infarction affecting the right body of the caudate and adjacent white matter tracks. 2. Old infarction in the left basal ganglia and radiating white matter tracts. 3. Moderate chronic small-vessel ischemic changes of the hemispheric white matter.  TTE completed with report pending.   Electronically signed: Dr. Caryl Pina

## 2021-01-07 NOTE — Progress Notes (Signed)
SLP Cancellation Note  Patient Details Name: Christopher Macdonald MRN: [id number] DOB: July 09, 1946   Cancelled treatment:       Reason Eval/Treat Not Completed: SLP screened, no needs identified, will sign off  Chart reviewed, nurse consulted and pt interviewed. No acute needs identified.   Happi B. Dreama Saa M.S., CCC-SLP, [facility] Speech-Language Pathologist Rehabilitation Services Office [phone]   Reuel Derby 01/07/2021, 11:43 AM

## 2021-01-07 NOTE — TOC Initial Note (Signed)
Transition of Care [facility]) - Initial/Assessment Note    Patient Details  Name: Christopher Macdonald MRN: [id number] Date of Birth: 1946/04/12  [facility] CM/SW Contact:    Magnus Ivan, LCSW Phone Number: 01/07/2021, 11:20 AM  Clinical Narrative:             Met with patient at bedside. Patient lives with a friend. Drives himself to appointments. PCP is Dr. Iona Beard. Pharmacy is [facility] in [place]. Patient has no DME, HH, or SNF history. Patient agreeable to recommendation for HHPT, no agency preference. Referral accepted by [facility] Representative [place].     Expected Discharge Plan: [place] Barriers to Discharge: Continued Medical Work up   Patient Goals and CMS Choice Patient states their goals for this hospitalization and ongoing recovery are:: home with home health CMS Medicare.gov Compare Post Acute Care list provided to:: Patient Choice offered to / list presented to : Patient  Expected Discharge Plan and Services Expected Discharge Plan: [place]       Living arrangements for the past 2 months: Single Family Home                           HH Arranged: PT [facility] Agency: [facility] Date [facility] Agency Contacted: 01/07/21   Representative spoke with at [place]: Jana Half  Prior Living Arrangements/Services Living arrangements for the past 2 months: [place] Lives with:: Friends Patient language and need for interpreter reviewed:: Yes Do you feel safe going back to the place where you live?: Yes      Need for Family Participation in Patient Care: Yes (Comment) Care giver support system in place?: Yes (comment)   Criminal Activity/Legal Involvement Pertinent to Current Situation/Hospitalization: No - Comment as needed  Activities of Daily Living Home Assistive Devices/Equipment: None ADL Screening (condition at time of admission) Patient's cognitive ability adequate to safely complete daily  activities?: Yes Is the patient deaf or have difficulty hearing?: No Does the patient have difficulty seeing, even when wearing glasses/contacts?: No Does the patient have difficulty concentrating, remembering, or making decisions?: No Patient able to express need for assistance with ADLs?: Yes Does the patient have difficulty dressing or bathing?: No Independently performs ADLs?: Yes (appropriate for developmental age) Does the patient have difficulty walking or climbing stairs?: No Weakness of Legs: None Weakness of Arms/Hands: None  Permission Sought/Granted Permission sought to share information with : Chartered certified accountant granted to share information with : Yes, Verbal Permission Granted     Permission granted to share info w AGENCY: [place] agencies        Emotional Assessment Appearance:: Appears stated age     Orientation: : Oriented to Self,Oriented to Place,Oriented to  Time,Oriented to Situation Alcohol / Substance Use: Not Applicable Psych Involvement: No (comment)  Admission diagnosis:  CVA (cerebral vascular accident) ([place]) [I63.9] Stroke [facility]) [I63.9] Cerebrovascular accident (CVA), unspecified mechanism ([place]) [I63.9] Patient Active Problem List   Diagnosis Date Noted  . CVA (cerebral vascular accident) ([place]) 01/06/2021  . Protein-calorie malnutrition, severe 12/13/2018  . Pneumonia 12/12/2018  . Atherosclerosis of native arteries of extremity with intermittent claudication ([place]) 08/26/2017  . Leg pain 08/26/2017  . COPD (chronic obstructive pulmonary disease) ([place]) 08/26/2017  . Essential hypertension 08/26/2017   PCP:  Sharyne Peach, MD Pharmacy:   [facility] [address] [place]  [place] [address] Phone: [phone] Fax: [phone]     Social Determinants of Health (SDOH) Interventions    Readmission Risk Interventions No flowsheet data found.

## 2021-01-07 NOTE — Progress Notes (Signed)
*  PRELIMINARY RESULTS* Echocardiogram 2D Echocardiogram has been performed.  Cristela Blue 01/07/2021, 8:57 AM

## 2021-01-08 DIAGNOSIS — M4802 Spinal stenosis, cervical region: Secondary | ICD-10-CM

## 2021-01-08 DIAGNOSIS — I63521 Cerebral infarction due to unspecified occlusion or stenosis of right anterior cerebral artery: Secondary | ICD-10-CM | POA: Diagnosis not present

## 2021-01-08 DIAGNOSIS — J439 Emphysema, unspecified: Secondary | ICD-10-CM

## 2021-01-08 DIAGNOSIS — I639 Cerebral infarction, unspecified: Secondary | ICD-10-CM | POA: Diagnosis present

## 2021-01-08 MED ORDER — CLOPIDOGREL BISULFATE 75 MG PO TABS
75.0000 mg | ORAL_TABLET | Freq: Every day | ORAL | 0 refills | Status: AC
Start: 2021-01-08 — End: ?

## 2021-01-08 MED ORDER — NICOTINE 21 MG/24HR TD PT24: 21.0000 mg | MEDICATED_PATCH | Freq: Every day | TRANSDERMAL | 0 refills | Status: AC

## 2021-01-08 NOTE — Consult Note (Signed)
Referring Physician:  No referring provider defined for this encounter.  Primary Physician:  Christopher Humphrey, MD  Chief Complaint:  stroke  History of Present Illness: 01/08/2021 Christopher Macdonald is a 75 y.o. male who presents with the chief complaint of stroke.  He is hear for management of that.  During workup, he underwent C spine MRI showing severe stenosis, prompting consultation.  Christopher Macdonald notes that he has had issues with balance including 2 falls in the past 6 months.  He has some weakness with opening jars, but no dexterity changes or weakness otherwise.  He denies tingling and numbness.  Denies bowel and bladder abnormalities.    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   Christopher Macdonald has symptoms of cervical myelopathy.   Review of Systems:  A 10 point review of systems is negative, except for the pertinent positives and negatives detailed in the HPI.  Past Medical History: Past Medical History:  Diagnosis Date  . COPD (chronic obstructive pulmonary disease) (HCC)   . Depression   . Elevated PSA   . Glaucoma   . Hepatitis   . HTN (hypertension)   . Tobacco abuse     Past Surgical History: Past Surgical History:  Procedure Laterality Date  . cataract sugery Right   . GLAUCOMA SURGERY  2015  . HERNIA REPAIR  2014    Allergies: Allergies as of 01/06/2021 - Review Complete 01/06/2021  Allergen Reaction Noted  . Wasp venom Swelling 05/12/2018    Medications:  Current Facility-Administered Medications:  .  acetaminophen (TYLENOL) tablet 650 mg, 650 mg, Oral, Q4H PRN **OR** acetaminophen (TYLENOL) 160 MG/5ML solution 650 mg, 650 mg, Per Tube, Q4H PRN **OR** acetaminophen (TYLENOL) suppository 650 mg, 650 mg, Rectal, Q4H PRN, Marrion Coy, MD .  albuterol (VENTOLIN HFA) 108 (90 Base) MCG/ACT inhaler 2 puff, 2 puff, Inhalation, BID, Marrion Coy, MD, 2 puff at 01/07/21 2145 .  aspirin EC tablet 81 mg, 81 mg, Oral, Daily, Marrion Coy, MD, 81  mg at 01/07/21 9622 .  atorvastatin (LIPITOR) tablet 40 mg, 40 mg, Oral, Daily, Marrion Coy, MD, 40 mg at 01/07/21 2979 .  brimonidine (ALPHAGAN) 0.2 % ophthalmic solution 1 drop, 1 drop, Right Eye, Daily, Marrion Coy, MD, 1 drop at 01/07/21 0817 .  clopidogrel (PLAVIX) tablet 75 mg, 75 mg, Oral, Daily, Marrion Coy, MD, 75 mg at 01/07/21 0944 .  dorzolamide-timolol (COSOPT) 22.3-6.8 MG/ML ophthalmic solution 1 drop, 1 drop, Right Eye, Daily, Marrion Coy, MD, 1 drop at 01/07/21 0817 .  enoxaparin (LOVENOX) injection 40 mg, 40 mg, Subcutaneous, Q24H, Marrion Coy, MD, 40 mg at 01/07/21 2145 .  feeding supplement (ENSURE ENLIVE / ENSURE PLUS) liquid 237 mL, 237 mL, Oral, BID BM, Marrion Coy, MD, 237 mL at 01/07/21 1351 .  fluticasone furoate-vilanterol (BREO ELLIPTA) 100-25 MCG/INH 1 puff, 1 puff, Inhalation, Daily, Chappell, Alex B, RPH .  latanoprost (XALATAN) 0.005 % ophthalmic solution 1 drop, 1 drop, Both Eyes, QHS, Marrion Coy, MD, 1 drop at 01/07/21 2144 .  losartan (COZAAR) tablet 50 mg, 50 mg, Oral, Daily, Marrion Coy, MD, 50 mg at 01/07/21 8921 .  melatonin tablet 5 mg, 5 mg, Oral, QHS, Manuela Schwartz, NP, 5 mg at 01/08/21 0008 .  nicotine (NICODERM CQ - dosed in mg/24 hours) patch 21 mg, 21 mg, Transdermal, Daily, Marrion Coy, MD, 21 mg at 01/07/21 1941 .  senna-docusate (Senokot-S) tablet 1 tablet, 1 tablet, Oral, QHS PRN, Marrion Coy, MD .  umeclidinium  bromide (INCRUSE ELLIPTA) 62.5 MCG/INH 1 puff, 1 puff, Inhalation, Daily, Tressie Ellis, RPH   Social History: Social History   Tobacco Use  . Smoking status: Current Every Day Smoker    Packs/day: 1.00    Years: 63.00    Pack years: 63.00    Types: Cigarettes  . Smokeless tobacco: Former Neurosurgeon    Types: Engineer, drilling  . Vaping Use: Never used  Substance Use Topics  . 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  . Drug use: Yes    Types: Marijuana     Family Medical History: Family History  Problem Relation Age of Onset  . CAD Mother   . Stroke Father   . Prostate cancer Neg Hx   . Kidney cancer Neg Hx   . Bladder Cancer Neg Hx     Physical Examination: Vitals:   01/08/21 0016 01/08/21 0515  BP: 132/70 134/75  Pulse: (!) 57 69  Resp: 18 18  Temp: 97.7 F (36.5 C) 97.7 F (36.5 C)  SpO2: 98% 99%     General: Patient is well developed, well nourished, calm, collected, and in no apparent distress.  Psychiatric: Patient is non-anxious.  Head:  Pupils equal, round, and reactive to light.  ENT:  Oral mucosa appears well hydrated.  Neck:   Supple.  Full range of motion.  Respiratory: Patient is breathing without any difficulty.  Extremities: No edema.  Vascular: Palpable pulses in dorsal pedal vessels.  Skin:   On exposed skin, there are no abnormal skin lesions.  NEUROLOGICAL:  General: In no acute distress.   Awake, alert, oriented to person, place, and time.  Pupils equal round and reactive to light.  Facial tone is symmetric.  Tongue protrusion is midline.  There is no pronator drift.  Speech slow and slurred, but fluent.  ROM of spine: diminished.  Palpation of spine: nontender.    Strength: Side Biceps Triceps Deltoid Interossei Grip Wrist Ext. Wrist Flex.  R 5 5 5 5  4+ 5 5  L 5 5 5 5  4+ 5 5   Side Iliopsoas Quads Hamstring PF DF EHL  R 5 5 5 5 5 5   L 5 5 5 5 5 5    Reflexes are 3+ and symmetric at the biceps, triceps, brachioradialis, patella and achilles.   He has a positive suprapatellar reflex. Bilateral upper and lower extremity sensation is intact to light touch. Gait is untested.  Hoffman's is present.  Imaging: MRI C spine 01/07/2021  IMPRESSION: 1. C3-4: 3 mm retrolisthesis. Endplate osteophytes and bulging of the disc. 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 AP diameter of the canal as narrow as 4 mm. Cord flattening. Abnormal T2 signal within the cord. Bilateral foraminal stenosis that  could affect either C4 nerve. 2. Mild bilateral foraminal narrowing at C4-5 and C5-6 but no likely neural compression at those levels.   Electronically Signed   By: M.D.   On: 01/07/2021 23:58  MRI Brain 01/06/21  IMPRESSION: 1. Acute infarction affecting the right body of the caudate and adjacent white matter tracks. 2. Old infarction in the left basal ganglia and radiating white matter tracts. 3. Moderate chronic small-vessel ischemic changes of the hemispheric white matter.   Electronically Signed   By: 01/09/2021 M.D.   On: 01/06/2021 18:17  I have personally reviewed the images and agree with the above interpretation.  Labs: CBC Latest Ref  Rng & Units 01/06/2021 12/13/2018 12/12/2018  WBC 4.0 - 10.5 K/uL 7.0 8.5 12.7(H)  Hemoglobin 13.0 - 17.0 g/dL 40.9 81.1 91.4  Hematocrit 39.0 - 52.0 % 41.5 39.7 43.8  Platelets 150 - 400 K/uL 138(L) 102(L) 122(L)       Assessment and Plan: Mr. Parcel is a pleasant 75 y.o. male with acute stroke and cervical spondylotic myelopathy.  He likely needs surgical intervention for his cervical stenosis and myelopathy, but needs to recover from his stroke to some degree before considering undergoing general anesthesia.  I will arrange for follow up in clinic in a few weeks to discuss further and likely arrange for surgery.   I have discussed the condition with the patient, including discussing treatment options in layman's terms.    Joseantonio Dittmar K. Myer Haff MD, MPHS Dept. of Neurosurgery

## 2021-01-08 NOTE — Care Management CC44 (Signed)
Condition Code 44 Documentation Completed  Patient Details  Name: Christopher Macdonald MRN: [id number] Date of Birth: 10/01/1946   Condition Code 44 given:  Yes Patient signature on Condition Code 44 notice:  Yes (patient gave permission to review via phone. copy of letter provided to patient.) Documentation of 2 MD's agreement:  Yes Code 44 added to claim:  Yes    Levent Kornegay E Harley Mccartney, LCSW 01/08/2021, 9:13 AM

## 2021-01-08 NOTE — TOC Transition Note (Signed)
Transition of Care [facility]) - CM/SW Discharge Note   Patient Details  Name: Christopher Macdonald MRN: [id number] Date of Birth: 09/04/1946  Transition of Care [facility]) CM/SW Contact:  Liliana Cline, LCSW Phone Number: 01/08/2021, 9:15 AM   Clinical Narrative:   Per MD, patient to discharge home today. PT no longer recommending HHPT. Spoke to patient who reported he would like to cancel HHPT that was set up, notified [facility]. Patient reported his roommate or brother should be able to transport him home at time of DC, he will let staff know if he ends up needing a ride so TOC can arrange one.   Final next level of care: Home/Self Care Barriers to Discharge: Barriers Resolved   Patient Goals and CMS Choice Patient states their goals for this hospitalization and ongoing recovery are:: to return home with roommate CMS Medicare.gov Compare Post Acute Care list provided to:: Patient Choice offered to / list presented to : Patient  Discharge Placement                Patient to be transferred to facility by: roommate or brother, will let staff know if unable to get a ride. Name of family member notified: patient notified Patient and family notified of of transfer: 01/08/21  Discharge Plan and Services                          HH Arranged: PT [facility] Agency: [facility] Date [facility] Agency Contacted: 01/07/21   Representative spoke with at [facility] Agency: Christy Gentles  Social Determinants of Health (SDOH) Interventions     Readmission Risk Interventions No flowsheet data found.

## 2021-01-08 NOTE — Discharge Summary (Signed)
Physician Discharge Summary  Patient ID: Christopher Macdonald MRN: [id number] DOB/AGE: 06/05/1946 75 y.o.  Admit date: 01/06/2021 Discharge date: 01/08/2021  Admission Diagnoses:  Discharge Diagnoses:  Active Problems:   COPD (chronic obstructive pulmonary disease) (HCC)   Essential hypertension   Severe protein-calorie malnutrition (HCC)   CVA (cerebral vascular accident) (HCC)   Cervical stenosis of spine   Stroke (HCC) Mild pulmonary hypertension. Moderate tricuspid regurgitation.  Discharged Condition: good  Hospital Course:  [name] an 75 y.o.malewith history of COPD, depression, essential hypertension, hepatitis, tobacco abuse who present to the hospital with a slurred speech and left facial droop. MRI showed acute infarction affecting the right body of the caudate.  CT angiogram of the head and neck did not show significant occlusion, showed a 1 mm brain aneurysm.  Also showed severe C3-C4 cervical stenosis. Patient was treated with aspirin, Plavix as well as statin. Echocardiogram showed ejection fraction normal, mild pulmonary hypertension.  #1.  Acute CVA. Continue aspirin, Plavix, Lipitor Symptoms essentially resolved.  Follow-up with neurology as outpatient.  2.  Cervical spine stenosis. Seen by neurosurgery, scheduled outpatient follow-up for surgery.  3.  Severe protein calorie malnutrition. Patient states that his appetite had daily improving for the last 2 months.  Follow-up with PCP.  4.  COPD. Stable     Consults: neurology and neurosurgery  Significant Diagnostic Studies: Echo: 1. Left ventricular ejection fraction, by estimation, is 60 to 65%. The left ventricle has normal function. The left ventricle has no regional wall motion abnormalities. Left ventricular diastolic parameters are indeterminate. 2. Right ventricular systolic function is normal. The right ventricular size is normal. There is mildly elevated pulmonary artery  systolic pressure. The estimated right ventricular systolic pressure is 36.6 mmHg. 3. Tricuspid valve regurgitation is moderate.  MRI CERVICAL SPINE WITHOUT AND WITH CONTRAST  TECHNIQUE: Multiplanar and multiecho pulse sequences of the cervical spine, to include the craniocervical junction and cervicothoracic junction, were obtained without and with intravenous contrast.  CONTRAST:  70mL GADAVIST GADOBUTROL 1 MMOL/ML IV SOLN  COMPARISON:  CT study yesterday.  FINDINGS: Alignment: Exaggerated cervical lordosis.  3 mm retrolisthesis C3-4.  Vertebrae: No fracture or primary bone lesion.  Cord: No primary cord lesion. See below regarding severe spinal stenosis at C3-4.  Posterior Fossa, vertebral arteries, paraspinal tissues: Negative  Disc levels:  Foramen magnum is widely patent. Ordinary mild osteoarthritis at the C1-2 articulation with mild encroachment upon the craniocervical junction but no affect upon neural structures.  C2-3: Normal interspace.  C3-4: 3 mm retrolisthesis. Degenerative spondylosis with endplate osteophytes and protruding disc material. Severe spinal stenosis with AP diameter of the canal as narrow as 4 mm. Effacement of the subarachnoid space and flattening of the cord. Abnormal T2 signal within the cord. Bilateral foraminal stenosis that could affect either C4 nerve.  C4-5: Endplate osteophytes and bulging of the disc. Narrowing of the ventral subarachnoid space but no compression of the cord. AP diameter of the canal in the midline 9 mm. Mild bilateral foraminal narrowing.  C5-6: Endplate osteophytes and bulging of the disc. Mild canal narrowing with AP diameter in the midline 8 mm. Mild bilateral foraminal narrowing.  C6-7: Minimal noncompressive disc bulge. No canal or foraminal stenosis.  C7-T1: Mild facet osteoarthritis on the left. No canal or foraminal stenosis.  IMPRESSION: 1. C3-4: 3 mm retrolisthesis. Endplate  osteophytes and bulging of the disc. 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 AP diameter of the canal as narrow as 4 mm. Cord flattening. Abnormal T2  signal within the cord. Bilateral foraminal stenosis that could affect either C4 nerve. 2. Mild bilateral foraminal narrowing at C4-5 and C5-6 but no likely neural compression at those levels.   Electronically Signed   By: Paulina Fusi M.D.   On: 01/07/2021 23:58  MRI HEAD WITHOUT CONTRAST  TECHNIQUE: Multiplanar, multiecho pulse sequences of the brain and surrounding structures were obtained without intravenous contrast.  COMPARISON:  CT studies earlier same day.  FINDINGS: Brain: Diffusion imaging confirms acute infarction in the right body of the caudate and adjacent white matter tracks. No other acute infarction. The brainstem and cerebellum are normal. Old infarction in the left basal ganglia and radiating white matter tracts. Moderate chronic small-vessel ischemic changes of the hemispheric white matter. No large vessel territory infarction. No mass lesion, hemorrhage, hydrocephalus or extra-axial collection. Old hemosiderin deposition in the left basal ganglia infarction.  Vascular: Major vessels at the base of the brain show flow.  Skull and upper cervical spine: Negative  Sinuses/Orbits: Clear/normal  Other: None  IMPRESSION: 1. Acute infarction affecting the right body of the caudate and adjacent white matter tracks. 2. Old infarction in the left basal ganglia and radiating white matter tracts. 3. Moderate chronic small-vessel ischemic changes of the hemispheric white matter.   Electronically Signed   By: Paulina Fusi M.D.   On: 01/06/2021 18:17  CT ANGIOGRAPHY HEAD AND NECK  TECHNIQUE: Multidetector CT imaging of the head and neck was performed using the standard protocol during bolus administration of intravenous contrast. Multiplanar CT image reconstructions and MIPs were obtained to evaluate  the vascular anatomy. Carotid stenosis measurements (when applicable) are obtained utilizing NASCET criteria, using the distal internal carotid diameter as the denominator.  CONTRAST:  67mL OMNIPAQUE IOHEXOL 350 MG/ML SOLN  COMPARISON:  None.  FINDINGS: CT HEAD  Brain: There is no acute intracranial hemorrhage or mass effect. 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 Suspected age-indeterminate small vessel infarct of the right caudate body and adjacent white matter. There is no extra-axial fluid collection. Prominence of the ventricles and sulci reflects mild parenchymal volume loss. There is mild superior cerebellar volume loss.  Vascular: No hyperdense vessel.  Skull: Calvarium is unremarkable.  Sinuses/Orbits: No acute finding.  Other: None.  Review of the MIP images confirms the above findings  CTA NECK  Aortic arch: Mixed plaque along the aortic arch and at the patent great vessel origins.  Right carotid system: Patent. Mixed but primarily calcified plaque at the proximal ICA causing less than 50% stenosis.  Left carotid system: Patent. Mixed but primarily calcified plaque at the proximal ICA causing less than 50% stenosis.  Vertebral arteries: Patent. Left vertebral artery is slightly dominant. No measurable stenosis or evidence of dissection.  Skeleton: Degenerative changes of the cervical spine, greatest at C3-C4 where there is retrolisthesis, disc osteophyte complex, and uncovertebral hypertrophy. Resulting severe canal and foraminal stenosis. Chronic thoracic compression fractures.  Other neck: No mass or adenopathy.  Upper chest: Advanced emphysema.  Review of the MIP images confirms the above findings  CTA HEAD  Anterior circulation: Intracranial internal carotid arteries are patent with mixed but primarily calcified plaque  no significant stenosis. There is a 1 mm inferiorly directed outpouching of the distal supraclinoid right ICA. Anterior and middle cerebral arteries are patent.  Posterior circulation: Intracranial vertebral arteries, basilar artery, and posterior cerebral arteries are patent. Major cerebellar artery origins are patent.  Venous sinuses: Patent as allowed by contrast bolus timing.  Review of the MIP images confirms the above findings  IMPRESSION: No acute intracranial hemorrhage or definite acute infarction. Suspected age-indeterminate, potentially acute infarct of the right caudate body and adjacent white matter.  No large vessel occlusion. Plaque at the ICA origins causes less than 50% stenosis.  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  Degenerative changes at C3-C4 with severe canal and foraminal stenosis.  Initial results were called by telephone at the time of interpretation on 01/06/2021 at 1:45 pm to provider [facility] , who verbally acknowledged these results.   Electronically Signed   By: Guadlupe Spanish M.D.   On: 01/06/2021 13:57   Treatments: Stroke workup  Discharge Exam: Blood pressure 134/75, pulse 69, temperature 97.7 F (36.5 C), temperature source Oral, resp. rate 18, height 6' (1.829 m), weight 52.2 kg, SpO2 99 %. General appearance: alert and cooperative Resp: clear to auscultation bilaterally Cardio: regular rate and rhythm, S1, S2 normal, no murmur, click, rub or gallop GI: soft, non-tender; bowel sounds normal; no masses,  no organomegaly Extremities: extremities normal, atraumatic, no cyanosis or edema  Neurologic examination: Patient has normal muscle strength, normal DTR, cranial nerves II to XII normal.  Disposition: Discharge disposition: 01-Home or Self Care       Discharge Instructions    Diet - low sodium heart healthy   Complete by: As directed    Increase activity slowly   Complete by: As directed       Allergies as of 01/08/2021      Reactions   Wasp Venom Swelling      Medication List    TAKE these medications   albuterol 108 (90 Base) MCG/ACT inhaler Commonly known as: VENTOLIN HFA Inhale 2 puffs into the lungs 2 (two) times daily.   aspirin EC 81 MG tablet Take 81 mg by mouth daily.   atorvastatin 40 MG tablet Commonly known as: LIPITOR Take 40 mg by mouth daily.   brimonidine 0.2 % ophthalmic solution Commonly known as: ALPHAGAN Place 1 drop into the right eye in the morning and at bedtime.   clopidogrel 75 MG tablet Commonly known as: PLAVIX Take 1 tablet (75 mg total) by mouth daily.   dorzolamide-timolol 22.3-6.8 MG/ML ophthalmic solution Commonly known as: COSOPT Place 1 drop into the right eye 2 (two) times daily.   losartan 50 MG tablet Commonly known as: COZAAR Take 50 mg by mouth daily.   Lumigan 0.01 % Soln Generic drug: bimatoprost Place 1 drop into the right eye daily.   nicotine 21 mg/24hr patch Commonly known as: NICODERM CQ - dosed in mg/24 hours Place 1 patch (21 mg total) onto the skin daily.   Trelegy Ellipta 100-62.5-25 MCG/INH Aepb Generic drug: Fluticasone-Umeclidin-Vilant Inhale 1 puff into the lungs daily.       Follow-up Information    Rayetta Humphrey, MD Follow up.   Specialty: Family Medicine Contact information: [address] [place] [address] [phone]        [facility] Follow up in 2 week(s).   Contact information: [address] [place] [address] [phone]       Venetia Night, MD Follow up in 2 week(s).   Specialty: Neurosurgery Contact information: [address] [place] [address] [phone]               Signed: Marrion Coy 01/08/2021, 9:39 AM

## 2021-05-18 ENCOUNTER — Other Ambulatory Visit: Payer: Self-pay

## 2021-05-18 DIAGNOSIS — R972 Elevated prostate specific antigen [PSA]: Secondary | ICD-10-CM

## 2021-05-19 ENCOUNTER — Encounter: Payer: Self-pay | Admitting: Urology

## 2021-05-19 ENCOUNTER — Other Ambulatory Visit: Payer: Self-pay

## 2021-05-25 ENCOUNTER — Ambulatory Visit: Payer: Self-pay | Admitting: Urology

## 2021-05-31 ENCOUNTER — Encounter: Payer: Self-pay | Admitting: Urology

## 2021-05-31 ENCOUNTER — Other Ambulatory Visit: Payer: Medicare HMO

## 2021-06-10 ENCOUNTER — Ambulatory Visit: Payer: Self-pay | Admitting: Urology

## 2021-06-11 ENCOUNTER — Encounter: Payer: Self-pay | Admitting: Urology

## 2021-10-20 ENCOUNTER — Encounter: Payer: Self-pay | Admitting: Ophthalmology

## 2021-10-27 ENCOUNTER — Encounter: Payer: Self-pay | Admitting: Ophthalmology

## 2021-10-27 ENCOUNTER — Encounter
Admission: RE | Disposition: A | Discharge status: Home / Self Care | Payer: Self-pay | Source: Home / Self Care | Attending: Ophthalmology

## 2021-10-27 ENCOUNTER — Ambulatory Visit: Payer: Medicare HMO | Admitting: Anesthesiology

## 2021-10-27 ENCOUNTER — Other Ambulatory Visit: Payer: Self-pay

## 2021-10-27 ENCOUNTER — Ambulatory Visit
Admission: RE | Admit: 2021-10-27 | Discharge: 2021-10-27 | Disposition: A | Discharge status: Home / Self Care | Payer: Medicare HMO | Attending: Ophthalmology | Admitting: Ophthalmology

## 2021-10-27 DIAGNOSIS — F32A Depression, unspecified: Secondary | ICD-10-CM | POA: Diagnosis not present

## 2021-10-27 DIAGNOSIS — J449 Chronic obstructive pulmonary disease, unspecified: Secondary | ICD-10-CM | POA: Insufficient documentation

## 2021-10-27 DIAGNOSIS — K759 Inflammatory liver disease, unspecified: Secondary | ICD-10-CM | POA: Diagnosis not present

## 2021-10-27 DIAGNOSIS — H401113 Primary open-angle glaucoma, right eye, severe stage: Secondary | ICD-10-CM | POA: Insufficient documentation

## 2021-10-27 DIAGNOSIS — Z87891 Personal history of nicotine dependence: Secondary | ICD-10-CM | POA: Diagnosis not present

## 2021-10-27 DIAGNOSIS — E039 Hypothyroidism, unspecified: Secondary | ICD-10-CM | POA: Diagnosis not present

## 2021-10-27 DIAGNOSIS — I739 Peripheral vascular disease, unspecified: Secondary | ICD-10-CM | POA: Diagnosis not present

## 2021-10-27 DIAGNOSIS — Z8673 Personal history of transient ischemic attack (TIA), and cerebral infarction without residual deficits: Secondary | ICD-10-CM | POA: Diagnosis not present

## 2021-10-27 DIAGNOSIS — I10 Essential (primary) hypertension: Secondary | ICD-10-CM | POA: Diagnosis not present

## 2021-10-27 DIAGNOSIS — F419 Anxiety disorder, unspecified: Secondary | ICD-10-CM | POA: Insufficient documentation

## 2021-10-27 HISTORY — PX: CATARACT EXTRACTION W/PHACO: SHX586

## 2021-10-27 HISTORY — DX: Low back pain, unspecified: M54.50

## 2021-10-27 HISTORY — DX: Presence of dental prosthetic device (complete) (partial): Z97.2

## 2021-10-27 SURGERY — PHACOEMULSIFICATION, CATARACT, WITH IOL INSERTION
Anesthesia: Monitor Anesthesia Care | Site: Eye | Laterality: Right

## 2021-10-27 MED ORDER — TRYPAN BLUE 0.06 % IO SOSY
PREFILLED_SYRINGE | INTRAOCULAR | Status: DC | PRN
  Administered 2021-10-27: .2 mL via INTRAOCULAR

## 2021-10-27 MED ORDER — SIGHTPATH DOSE#1 BSS IO SOLN
INTRAOCULAR | Status: DC | PRN
  Administered 2021-10-27: 14:00:00 48 mL via OPHTHALMIC

## 2021-10-27 MED ORDER — SIGHTPATH DOSE#1 BSS IO SOLN
INTRAOCULAR | Status: DC | PRN
  Administered 2021-10-27: 1 mL via INTRAMUSCULAR

## 2021-10-27 MED ORDER — TETRACAINE HCL 0.5 % OP SOLN
1.0000 [drp] | OPHTHALMIC | Status: DC | PRN
  Administered 2021-10-27: 13:00:00 1 [drp] via OPHTHALMIC

## 2021-10-27 MED ORDER — LABETALOL HCL 5 MG/ML IV SOLN
5.0000 mg | INTRAVENOUS | Status: DC | PRN
  Administered 2021-10-27 (×2): 5 mg via INTRAVENOUS

## 2021-10-27 MED ORDER — SIGHTPATH DOSE#1 NA CHONDROIT SULF-NA HYALURON 20-15 MG/0.5ML IO SOSY
INTRAOCULAR | Status: DC | PRN
  Administered 2021-10-27: .5 mL via INTRAOCULAR

## 2021-10-27 MED ORDER — SIGHTPATH DOSE#1 NA HYALUR & NA CHOND-NA HYALUR IO KIT
PACK | INTRAOCULAR | Status: DC | PRN
  Administered 2021-10-27: 1 via OPHTHALMIC

## 2021-10-27 MED ORDER — MIDAZOLAM HCL 2 MG/2ML IJ SOLN
INTRAMUSCULAR | Status: DC | PRN
  Administered 2021-10-27: 1 mg via INTRAVENOUS

## 2021-10-27 MED ORDER — FENTANYL CITRATE (PF) 100 MCG/2ML IJ SOLN
INTRAMUSCULAR | Status: DC | PRN
  Administered 2021-10-27: 50 ug via INTRAVENOUS

## 2021-10-27 MED ORDER — ONDANSETRON HCL 4 MG/2ML IJ SOLN: 4.0000 mg | Freq: Once | INTRAMUSCULAR | Status: DC | PRN

## 2021-10-27 MED ORDER — ACETAMINOPHEN 10 MG/ML IV SOLN: 1000.0000 mg | Freq: Once | INTRAVENOUS | Status: DC | PRN

## 2021-10-27 MED ORDER — LACTATED RINGERS IV SOLN: INTRAVENOUS | Status: DC

## 2021-10-27 MED ORDER — SIGHTPATH DOSE#1 BSS IO SOLN
INTRAOCULAR | Status: DC | PRN
  Administered 2021-10-27: 15 mL

## 2021-10-27 SURGICAL SUPPLY — 9 items
CANNULA ANT/CHMB 27G (MISCELLANEOUS) IMPLANT
CANNULA ANT/CHMB 27GA (MISCELLANEOUS) ×2 IMPLANT
ICLIP (OPHTHALMIC RELATED) ×1 IMPLANT
NDL FILTER BLUNT 18X1 1/2 (NEEDLE) ×2 IMPLANT
NEEDLE FILTER BLUNT 18X 1/2SAF (NEEDLE) ×1
NEEDLE FILTER BLUNT 18X1 1/2 (NEEDLE) ×1 IMPLANT
PACK EYE AFTER SURG (MISCELLANEOUS) ×3 IMPLANT
SYSTEM OMNI OPHTHALMIC STRL (OPHTHALMIC) ×1 IMPLANT
WATER STERILE IRR 500ML POUR (IV SOLUTION) ×3 IMPLANT

## 2021-10-27 NOTE — Anesthesia Procedure Notes (Signed)
Procedure Name: MAC Date/Time: 10/27/2021 1:35 PM Performed by: Mayme Genta, CRNA Pre-anesthesia Checklist: Patient identified, Emergency Drugs available, Suction available, Timeout performed and Patient being monitored Patient Re-evaluated:Patient Re-evaluated prior to induction Oxygen Delivery Method: Nasal cannula Placement Confirmation: positive ETCO2

## 2021-10-27 NOTE — H&P (Signed)
[facility]   Primary Care Physician:  Rayetta Humphrey, MD Ophthalmologist: Dr. Lockie Mola  Pre-Procedure History & Physical: HPI:  Christopher Macdonald is a 75 y.o. male here for ophthalmic surgery.   Past Medical History:  Diagnosis Date   COPD (chronic obstructive pulmonary disease) (HCC)    Depression    Elevated PSA    Glaucoma    Hepatitis    age 62.  received treatment   HTN (hypertension)    Lower back pain    Stroke (HCC) 01/06/2021   No Deficits   Tobacco abuse    Wears dentures    full upper and lower    Past Surgical History:  Procedure Laterality Date   BACK SURGERY     cataract sugery Right    COLONOSCOPY     GLAUCOMA SURGERY  2015   HERNIA REPAIR  2014   LAPAROSCOPIC PARTIAL COLECTOMY  05/30/2013   with anastomosis. [facility]   TONSILLECTOMY      Prior to Admission medications   Medication Sig Start Date End Date Taking? Authorizing Provider  albuterol (PROVENTIL HFA;VENTOLIN HFA) 108 (90 Base) MCG/ACT inhaler Inhale 2 puffs into the lungs 2 (two) times daily.  06/22/17 10/20/21 Yes [provider]  aspirin EC 81 MG tablet Take 81 mg by mouth daily.    Yes [provider]  brimonidine (ALPHAGAN) 0.2 % ophthalmic solution Place 30 drops into the right eye in the morning and at bedtime. 06/20/17  Yes [provider]  dorzolamide-timolol (COSOPT) 22.3-6.8 MG/ML ophthalmic solution Place 1 drop into the right eye 2 (two) times daily. 11/12/18  Yes [provider]  losartan (COZAAR) 50 MG tablet Take 50 mg by mouth daily.  06/29/17 10/27/21 Yes [provider]  LUMIGAN 0.01 % SOLN Place 1 drop into the right eye daily. 11/10/18  Yes [provider]  magnesium oxide (MAG-OX) 400 MG tablet Take 400 mg by mouth daily.   Yes [provider]  mirtazapine (REMERON) 7.5 MG tablet Take 7.5 mg by mouth at bedtime.   Yes [provider]  nicotine (NICODERM CQ - DOSED IN MG/24 HOURS) 21 mg/24hr  patch Place 1 patch (21 mg total) onto the skin daily. 01/08/21  Yes Marrion Coy, MD  clopidogrel (PLAVIX) 75 MG tablet Take 1 tablet (75 mg total) by mouth daily. 01/08/21   Marrion Coy, MD  gabapentin (NEURONTIN) 100 MG capsule Take 500 mg by mouth daily.  01/03/18 12/07/20  [provider]  potassium chloride SA (K-DUR,KLOR-CON) 20 MEQ tablet Take 2 tablets (40 mEq total) by mouth daily. 12/15/18 12/07/20  Auburn Bilberry, MD    Allergies as of 10/05/2021 - Review Complete 01/06/2021  Allergen Reaction Noted   Wasp venom Swelling 05/12/2018    Family History  Problem Relation Age of Onset   CAD Mother    Stroke Father    Prostate cancer Neg Hx    Kidney cancer Neg Hx    Bladder Cancer Neg Hx     Social History   Socioeconomic History   Marital status: Divorced    Spouse name: Not on file   Number of children: Not on file   Years of education: Not on file   Highest education level: Not on file  Occupational History   Not on file  Tobacco Use   Smoking status: Former    Packs/day: 1.00    Years: 63.00    Pack years: 63.00    Types: Cigarettes  Quit date: 08/14/2021    Years since quitting: 0.2   Smokeless tobacco: Former    Types: Associate Professor Use: Never used  Substance and Sexual Activity   Alcohol use: Not Currently    Comment: occasional alcohol now, 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   Drug use: Yes    Types: Marijuana   Sexual activity: Not on file  Other Topics Concern   Not on file  Social History Narrative   Independent at baseline.  Lives at home, has a roommate   Social Determinants of Corporate investment banker Strain: Not on file  Food Insecurity: Not on file  Transportation Needs: Not on file  Physical Activity: Not on file  Stress: Not on file  Social Connections: Not on file  Intimate Partner Violence: Not on file    Review of Systems: See HPI, otherwise negative ROS  Physical Exam: BP (!) 169/105     Pulse 71    Temp 97.8 F (36.6 C) (Temporal)    Ht 5\' 10"  (1.778 m)    Wt 54.4 kg    SpO2 99%    BMI 17.22 kg/m  General:   Alert,  pleasant and cooperative in NAD Head:  Normocephalic and atraumatic. Lungs:  Clear to auscultation.    Heart:  Regular rate and rhythm.   Impression/Plan: Christopher Macdonald is here for ophthalmic surgery.  Risks, benefits, limitations, and alternatives regarding ophthalmic surgery have been reviewed with the patient.  Questions have been answered.  All parties agreeable.   Humberto Leep, MD  10/27/2021, 12:51 PM

## 2021-10-27 NOTE — Anesthesia Postprocedure Evaluation (Signed)
Anesthesia Post Note  Patient: Christopher Macdonald  Procedure(s) Performed: OMNI CANALOPLASTY / TRABECULOTOMY RIGHT (Right: Eye)     Patient location during evaluation: PACU Anesthesia Type: MAC Level of consciousness: awake and alert Pain management: pain level controlled Vital Signs Assessment: post-procedure vital signs reviewed and stable Respiratory status: spontaneous breathing, nonlabored ventilation, respiratory function stable and patient connected to nasal cannula oxygen Cardiovascular status: stable and blood pressure returned to baseline Postop Assessment: no apparent nausea or vomiting Anesthetic complications: no   No notable events documented.  Eben Choinski A  Lennart Gladish

## 2021-10-27 NOTE — Transfer of Care (Signed)
Immediate Anesthesia Transfer of Care Note  Patient: Christopher Macdonald  Procedure(s) Performed: OMNI CANALOPLASTY / TRABECULOTOMY RIGHT (Right: Eye)  Patient Location: PACU  Anesthesia Type: MAC  Level of Consciousness: awake, alert  and patient cooperative  Airway and Oxygen Therapy: Patient Spontanous Breathing and Patient connected to supplemental oxygen  Post-op Assessment: Post-op Vital signs reviewed, Patient's Cardiovascular Status Stable, Respiratory Function Stable, Patent Airway and No signs of Nausea or vomiting  Post-op Vital Signs: Reviewed and stable  Complications: No notable events documented.

## 2021-10-27 NOTE — Op Note (Signed)
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    COMPLICATIONS:  None.   DESCRIPTION OF PROCEDURE:  The patient was identified in the holding room and transported to the operating room and placed in the supine position under the operating microscope.  The right eye was identified as the operative eye and it was prepped and draped in the usual sterile ophthalmic fashion.   A 1 millimeter clear-corneal paracentesis was made at the 12:00 position.  0.5 ml of preservative-free 1% lidocaine was injected into the anterior chamber.  Vision blue dye was placed into the anterior chamber to stain the trabecular meshwork to improve visualization.  It was rinsed from the eye using balanced salt solution.  The anterior chamber was filled with Viscoat viscoelastic.  A 2.4 millimeter keratome was used to make a near-clear corneal incision at the 9:00  position.    The microscope was adjusted and a gonioprism was used to visulaize the trabecular meshwork.  The Omni device was inserted and advanced toward the trabecular meshwork.  The cannula tip was used to gain access to Schlemm's canal.  The microcatheter was advanced into the superior angle for 180 degrees.  Upon retraction of the microcatheter, a controlled amount of Provisc was delivered into Schlemm's canal.  The cannula was withdrawn from the anterior chamber and additional Viscoat was placed into the eye.  The Omni was reinserted in the opposite direction to perform 180 degree canaloplasty of the inferior 180 degrees as noted above.  After canaloplasty was completed, the microcatheter was advanced into the canal for 180  degrees in the inferior angle.  The catheter was used to unroof the canal by gently withdrawing it while exiting the eye.  The remaining viscoelastic was aspirated.   Wounds were hydrated with balanced salt solution.  The anterior chamber was inflated to a physiologic pressure with balanced salt solution.  No wound leaks were noted. Cefuroxime 0.1 ml of a 10mg /ml solution was injected into the anterior chamber for a dose of 1 mg of intracameral antibiotic at the completion of the case.   The patient was taken to the recovery room in stable condition without complications of anesthesia or surgery.

## 2021-10-27 NOTE — Anesthesia Preprocedure Evaluation (Signed)
Anesthesia Evaluation  Patient identified by MRN, date of birth, ID band Patient awake    Reviewed: Allergy & Precautions, NPO status , Patient's Chart, lab work & pertinent test results, reviewed documented beta blocker date and time   History of Anesthesia Complications Negative for: history of anesthetic complications  Airway Mallampati: II  TM Distance: >3 FB Neck ROM: Limited    Dental  (+) Upper Dentures, Lower Dentures   Pulmonary COPD, Current SmokerPatient did not abstain from smoking., former smoker (63 pack years, quit 08/2021),    breath sounds clear to auscultation       Cardiovascular hypertension, Pt. on medications and Pt. on home beta blockers (-) angina+ Peripheral Vascular Disease  (-) DOE + dysrhythmias  Rhythm:Regular Rate:Normal     Neuro/Psych PSYCHIATRIC DISORDERS Anxiety Depression  Neuromuscular disease (Cervical spine stenosis) CVA (12/2020)    GI/Hepatic neg GERD  ,(+)     substance abuse  marijuana use, Hepatitis -  Endo/Other  Hypothyroidism   Renal/GU      Musculoskeletal   Abdominal   Peds  Hematology   Anesthesia Other Findings   Reproductive/Obstetrics                            Anesthesia Physical Anesthesia Plan  ASA: 3  Anesthesia Plan: MAC   Post-op Pain Management:    Induction: Intravenous  PONV Risk Score and Plan: 1 and TIVA, Midazolam and Treatment may vary due to age or medical condition  Airway Management Planned: Nasal Cannula  Additional Equipment:   Intra-op Plan:   Post-operative Plan:   Informed Consent: I have reviewed the patients History and Physical, chart, labs and discussed the procedure including the risks, benefits and alternatives for the proposed anesthesia with the patient or authorized representative who has indicated his/her understanding and acceptance.       Plan Discussed with: CRNA and  Anesthesiologist  Anesthesia Plan Comments:         Anesthesia Quick Evaluation

## 2021-10-28 ENCOUNTER — Encounter: Payer: Self-pay | Admitting: Ophthalmology

## 2021-12-02 ENCOUNTER — Other Ambulatory Visit: Payer: Self-pay | Admitting: *Deleted

## 2021-12-02 DIAGNOSIS — Z87891 Personal history of nicotine dependence: Secondary | ICD-10-CM

## 2021-12-16 ENCOUNTER — Ambulatory Visit: Payer: Medicare HMO | Attending: Acute Care

## 2022-01-04 IMAGING — MR MR CERVICAL SPINE WO/W CM
5 of 8 series · 28 of 48 positions shown · IV contrast (gadavist)
Comparison: CT study yesterday.

CLINICAL DATA: Acute right brain infarction. Anterior cord
syndrome.

EXAM:
MRI CERVICAL SPINE WITHOUT AND WITH CONTRAST
TECHNIQUE: Multiplanar and multiecho pulse sequences of the cervical spine, to
include the craniocervical junction and cervicothoracic junction,
were obtained without and with intravenous contrast.
CONTRAST:  5mL GADAVIST GADOBUTROL 1 MMOL/ML IV SOLN

[Series 5: T2 · sagittal · 3.0mm · 0.62mm/px · 4 of 15 slices shown (1 of 2)]
[im 1/15]
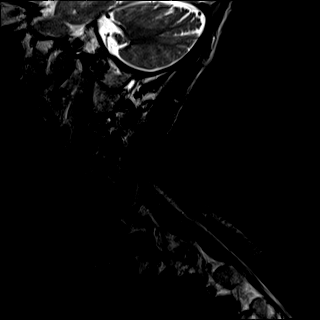
[im 5/15]
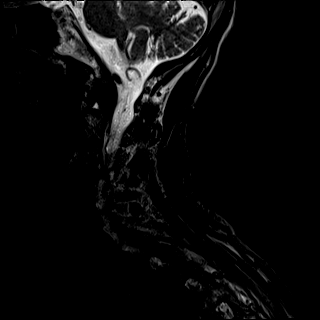
[im 10/15]
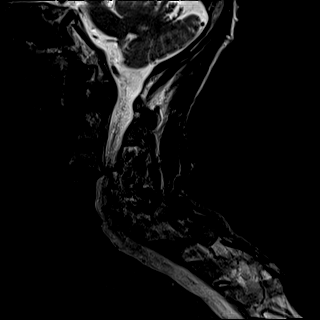
[im 15/15]
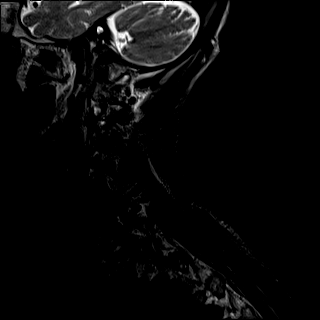

[Series 7: STIR · sagittal · 3.0mm · 0.62mm/px · 4 of 15 slices shown]
[im 1/15]
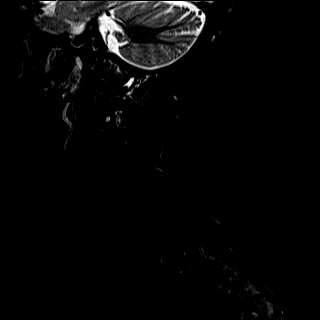
[im 5/15]
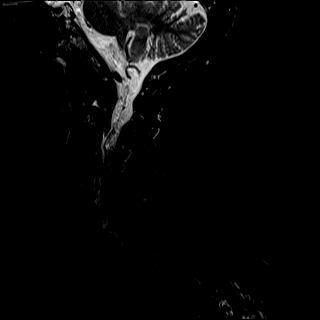
[im 10/15]
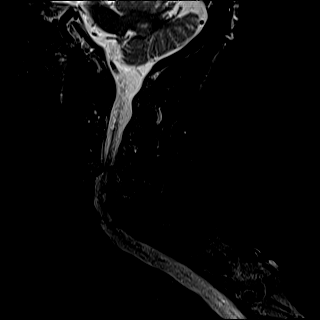
[im 15/15]
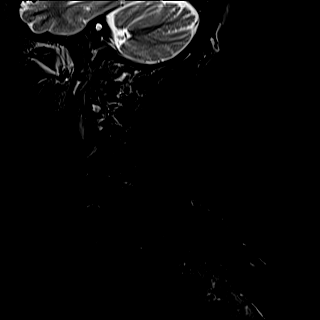

[Series 8: T2 · axial · 3.0mm · 0.70mm/px · z∈[-120,-19]mm · 8 of 31 slices shown (2 of 2)]
[im 1/31]
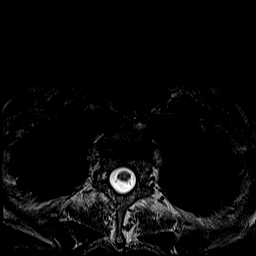
[im 5/31]
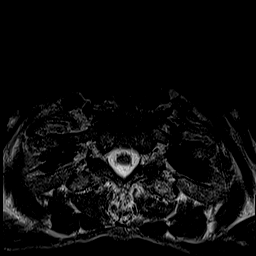
[im 9/31]
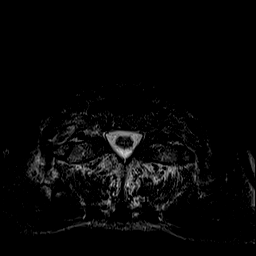
[im 13/31]
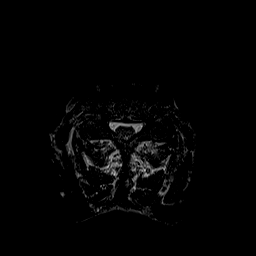
[im 18/31]
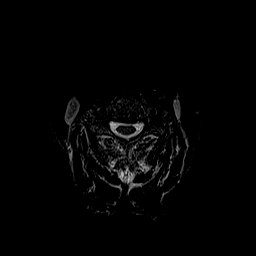
[im 22/31]
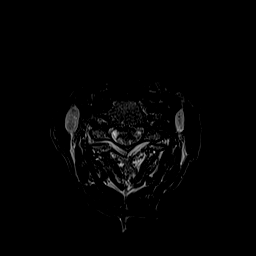
[im 26/31]
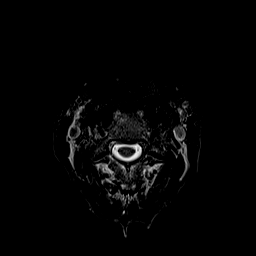
[im 31/31]
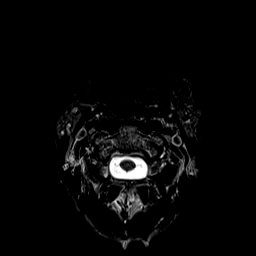

[Series 10: T1 · axial · non-contrast · 3.0mm · 0.35mm/px · z∈[-120,-19]mm · 8 of 31 slices shown]
[im 1/31]
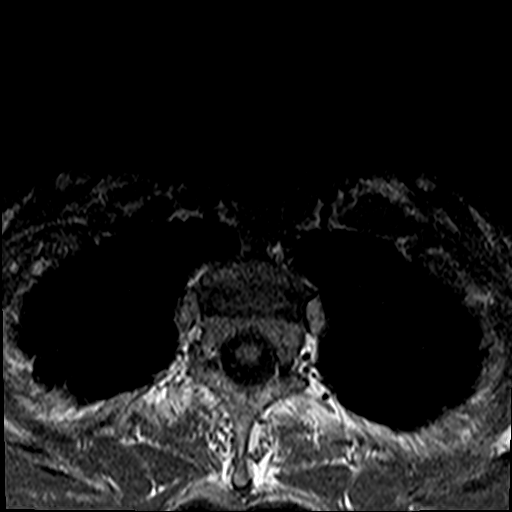
[im 5/31]
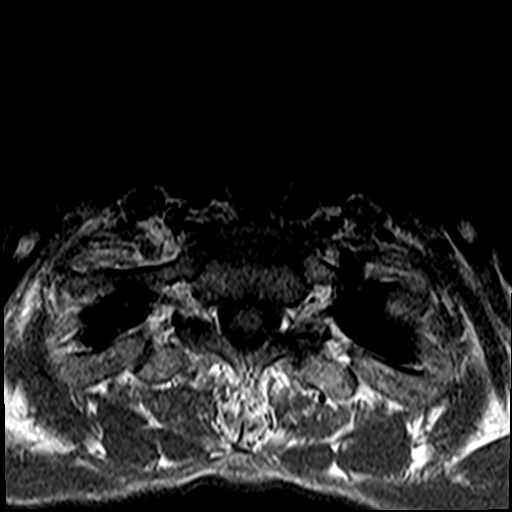
[im 9/31]
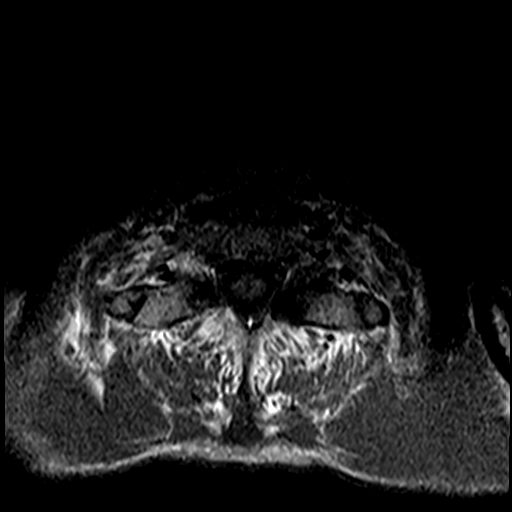
[im 13/31]
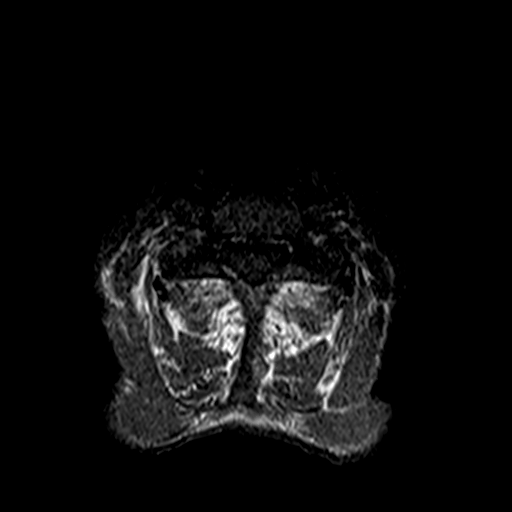
[im 18/31]
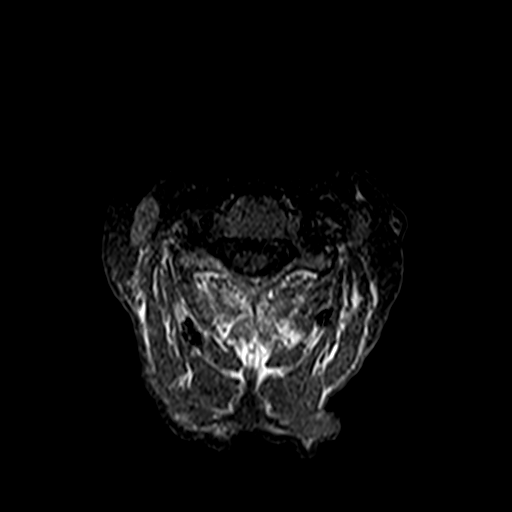
[im 22/31]
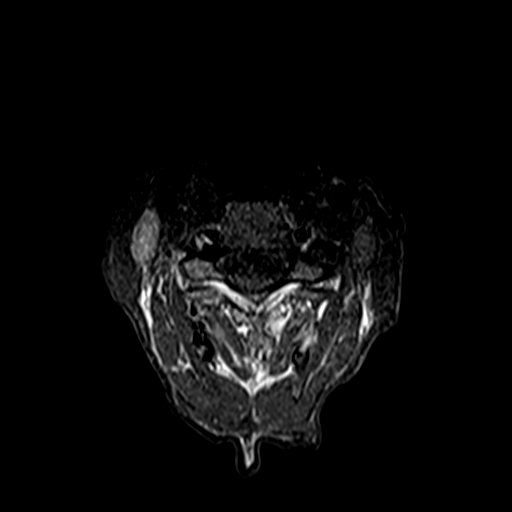
[im 26/31]
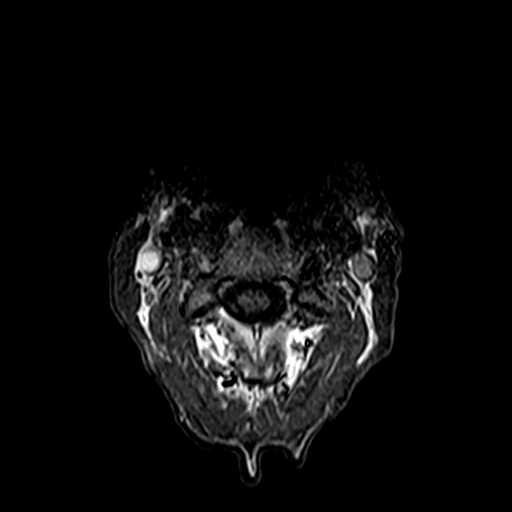
[im 31/31]
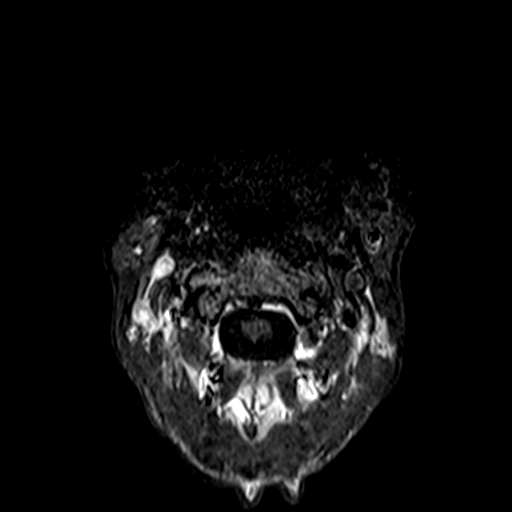

[Series 12: T1 post-contrast · axial · 3.0mm · 0.35mm/px · z∈[-120,-80]mm · 4 of 31 slices shown]
[im 1/31]
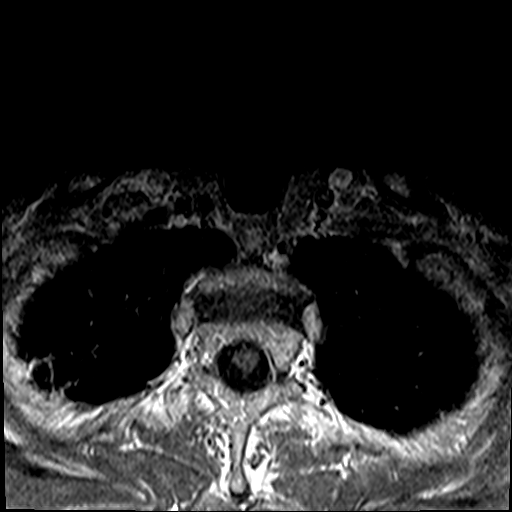
[im 5/31]
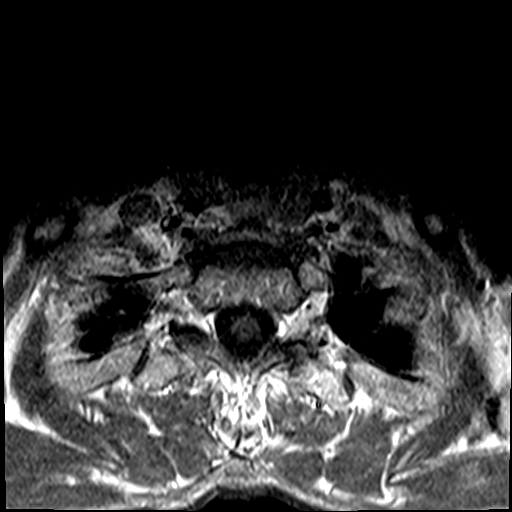
[im 9/31]
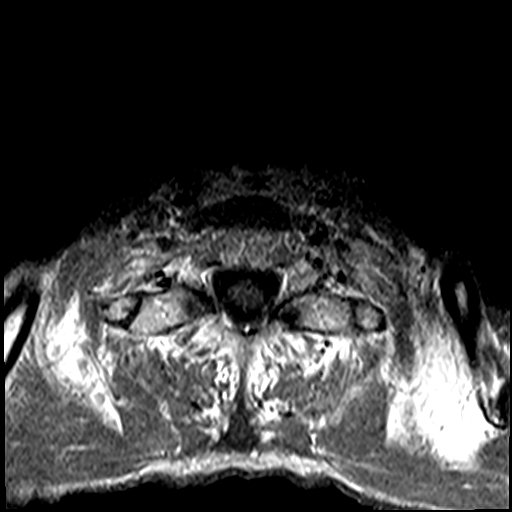
[im 13/31]
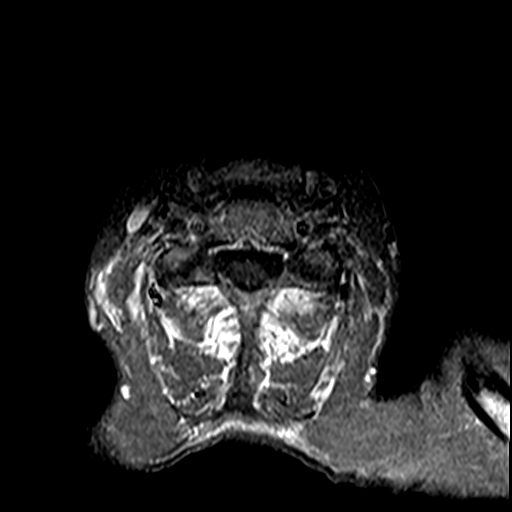

[28 of 48 positions shown; findings below may reference images not displayed]

FINDINGS: Alignment: Exaggerated cervical lordosis.  3 mm retrolisthesis C3-4.

Vertebrae: No fracture or primary bone lesion.

Cord: No primary cord lesion. See below regarding severe spinal
stenosis at C3-4.

Posterior Fossa, vertebral arteries, paraspinal tissues: Negative

Disc levels:

Foramen magnum is widely patent. Ordinary mild osteoarthritis at the
C1-2 articulation with mild encroachment upon the craniocervical
junction but no affect upon neural structures.

C2-3: Normal interspace.

C3-4: 3 mm retrolisthesis. Degenerative spondylosis with endplate
osteophytes and protruding disc material. Severe spinal stenosis
with AP diameter of the canal as narrow as 4 mm. Effacement of the
subarachnoid space and flattening of the cord. Abnormal T2 signal
within the cord. Bilateral foraminal stenosis that could affect
either C4 nerve.

C4-5: Endplate osteophytes and bulging of the disc. Narrowing of the
ventral subarachnoid space but no compression of the cord. AP
diameter of the canal in the midline 9 mm. Mild bilateral foraminal
narrowing.

C5-6: Endplate osteophytes and bulging of the disc. Mild canal
narrowing with AP diameter in the midline 8 mm. Mild bilateral
foraminal narrowing.

C6-7: Minimal noncompressive disc bulge. No canal or foraminal
stenosis.

C7-T1: Mild facet osteoarthritis on the left. No canal or foraminal
stenosis.
IMPRESSION: 1. C3-4: 3 mm retrolisthesis. Endplate osteophytes and bulging of
the disc. Severe spinal stenosis at C3-4. AP diameter of the canal
as narrow as 4 mm. Cord flattening. Abnormal T2 signal within the
cord. Bilateral foraminal stenosis that could affect either C4
nerve.
2. Mild bilateral foraminal narrowing at C4-5 and C5-6 but no likely
neural compression at those levels.

## 2022-03-22 ENCOUNTER — Ambulatory Visit
Admission: RE | Admit: 2022-03-22 | Discharge: 2022-03-22 | Disposition: A | Discharge status: Home / Self Care | Payer: Medicare HMO | Source: Ambulatory Visit | Attending: Family Medicine | Admitting: Family Medicine

## 2022-03-22 ENCOUNTER — Ambulatory Visit
Admission: RE | Admit: 2022-03-22 | Discharge: 2022-03-22 | Disposition: A | Discharge status: Home / Self Care | Payer: Medicare HMO | Attending: Family Medicine | Admitting: Family Medicine

## 2022-03-22 ENCOUNTER — Other Ambulatory Visit: Payer: Self-pay | Admitting: Family Medicine

## 2022-03-22 DIAGNOSIS — R0781 Pleurodynia: Secondary | ICD-10-CM | POA: Insufficient documentation

## 2022-09-22 ENCOUNTER — Telehealth: Payer: Self-pay | Admitting: *Deleted

## 2022-09-22 ENCOUNTER — Encounter: Payer: Self-pay | Admitting: *Deleted

## 2022-09-22 NOTE — Telephone Encounter (Signed)
Attempted to call pt. About needing follow up LCS CT scan. Voicemail full. Will mail letter.

## 2022-09-30 ENCOUNTER — Other Ambulatory Visit: Payer: Self-pay

## 2022-09-30 DIAGNOSIS — Z122 Encounter for screening for malignant neoplasm of respiratory organs: Secondary | ICD-10-CM

## 2022-09-30 DIAGNOSIS — Z87891 Personal history of nicotine dependence: Secondary | ICD-10-CM

## 2022-10-18 ENCOUNTER — Ambulatory Visit: Admission: RE | Admit: 2022-10-18 | Payer: Medicare HMO | Source: Ambulatory Visit

## 2023-01-09 ENCOUNTER — Other Ambulatory Visit: Payer: Self-pay

## 2023-01-09 ENCOUNTER — Emergency Department: Payer: Medicare HMO

## 2023-01-09 ENCOUNTER — Encounter: Payer: Self-pay | Admitting: Emergency Medicine

## 2023-01-09 ENCOUNTER — Emergency Department
Admission: EM | Admit: 2023-01-09 | Discharge: 2023-01-09 | Disposition: A | Discharge status: Home / Self Care | Payer: Medicare HMO | Attending: Emergency Medicine | Admitting: Emergency Medicine

## 2023-01-09 DIAGNOSIS — S79912A Unspecified injury of left hip, initial encounter: Secondary | ICD-10-CM | POA: Diagnosis present

## 2023-01-09 DIAGNOSIS — I10 Essential (primary) hypertension: Secondary | ICD-10-CM | POA: Diagnosis not present

## 2023-01-09 DIAGNOSIS — S7002XA Contusion of left hip, initial encounter: Secondary | ICD-10-CM | POA: Insufficient documentation

## 2023-01-09 DIAGNOSIS — W19XXXA Unspecified fall, initial encounter: Secondary | ICD-10-CM | POA: Insufficient documentation

## 2023-01-09 DIAGNOSIS — J449 Chronic obstructive pulmonary disease, unspecified: Secondary | ICD-10-CM | POA: Diagnosis not present

## 2023-01-09 MED ORDER — HYDROCODONE-ACETAMINOPHEN 5-325 MG PO TABS: 1.0000 | ORAL_TABLET | Freq: Three times a day (TID) | ORAL | 0 refills | Status: AC | PRN

## 2023-01-09 NOTE — ED Triage Notes (Signed)
Patient to ED after fall while getting out of car. Patient c/o left hip pain. Ambulatory without assistance to triage. Hx of hypertension- has taken BP meds today

## 2023-01-09 NOTE — Discharge Instructions (Addendum)
Your exam and XR are normal and reassuring. There is no evidence of a fracture to the hip following your fall. Take the prescription pain medicine as needed. Follow-up with your primary provider as needed.

## 2023-01-09 NOTE — ED Provider Notes (Signed)
[facility] Emergency Department Provider Note     Event Date/Time   First MD Initiated Contact with Patient 01/09/23 1716     (approximate)   History   Fall (/)   HPI  Christopher Macdonald is a 77 y.o. male with a history of hypertension, hepatitis, COPD, and CVA, presents to the ED following mechanical fall.  Patient reports he was at a local clinical change business, he was asked by the technician to get out of the car to view a broken equipment on his engine.  Patient notes that when he got out of the car he walked towards front of the car, and he apparently fell into the oil change well under the car.  He reports pain on the left hip, presents to the ED with acute left hip pain.  Patient amatory to triage without assistance.  He denies any other injury at the time.  He reports taking his blood pressure medicine as prescribed.  Physical Exam   Triage Vital Signs: ED Triage Vitals  Enc Vitals Group     BP 01/09/23 1542 (!) 218/103     Pulse Rate 01/09/23 1542 75     Resp 01/09/23 1542 18     Temp 01/09/23 1542 97.6 F (36.4 C)     Temp Source 01/09/23 1542 Oral     SpO2 01/09/23 1542 93 %     Weight --      Height --      Head Circumference --      Peak Flow --      Pain Score 01/09/23 1541 8     Pain Loc --      Pain Edu? --      Excl. in GC? --     Most recent vital signs: Vitals:   01/09/23 1542 01/09/23 1825  BP: (!) 218/103 (!) 195/103  Pulse: 75 74  Resp: 18 17  Temp: 97.6 F (36.4 C)   SpO2: 93% 99%    General Awake, no distress. NAD CV:  Good peripheral perfusion.  RESP:  Normal effort.  ABD:  No distention.  MSK:  Active range of motion to the left lower extremity.  Normal flexion extension range to the hip.  No pain with internal/external rotation.   ED Results / Procedures / Treatments   Labs (all labs ordered are listed, but only abnormal results are displayed) Labs Reviewed - No data to  display   EKG    RADIOLOGY  I personally viewed and evaluated these images as part of my medical decision making, as well as reviewing the written report by the radiologist.  ED Provider Interpretation: No acute findings  DG Hip Unilat W or Wo Pelvis 2-3 Views Left  Result Date: 01/09/2023 CLINICAL DATA:  Left hip pain after fall. EXAM: DG HIP (WITH OR WITHOUT PELVIS) 2-3V LEFT COMPARISON:  None Available. FINDINGS: There is no evidence of hip fracture or dislocation. There is no evidence of arthropathy or other focal bone abnormality. IMPRESSION: Negative. Electronically Signed   By: Lupita Raider M.D.   On: 01/09/2023 16:39     PROCEDURES:  Critical Care performed: No  Procedures   MEDICATIONS ORDERED IN ED: Medications - No data to display   IMPRESSION / MDM / ASSESSMENT AND PLAN / ED COURSE  I reviewed the triage vital signs and the nursing notes.  Differential diagnosis includes, but is not limited to, hip contusion, hip strain, hip fracture, hip dislocation  Patient's presentation is most consistent with acute complicated illness / injury requiring diagnostic workup.  Patient's diagnosis is consistent with hip contusion and strain.  Radiologic evidence of any acute fracture dislocation, based on my interpretation.  Patient will be discharged home with prescriptions for hydrocodone (#9). Patient is to follow up with primary provider as needed or otherwise directed. Patient is given ED precautions to return to the ED for any worsening or new symptoms.  FINAL CLINICAL IMPRESSION(S) / ED DIAGNOSES   Final diagnoses:  Fall, initial encounter  Contusion of left hip, initial encounter     Rx / DC Orders   ED Discharge Orders          Ordered    HYDROcodone-acetaminophen (NORCO) 5-325 MG tablet  3 times daily PRN        01/09/23 1802             Note:  This document was prepared using Dragon voice recognition software and may  include unintentional dictation errors.    Lissa Hoard, PA-C 01/10/23 5784    Dionne Bucy, MD 01/11/23 1601

## 2023-06-05 ENCOUNTER — Encounter: Payer: Self-pay | Admitting: *Deleted

## 2023-06-12 ENCOUNTER — Ambulatory Visit: Payer: Medicare HMO | Admitting: Anesthesiology

## 2023-06-12 ENCOUNTER — Ambulatory Visit
Admission: RE | Admit: 2023-06-12 | Discharge: 2023-06-12 | Disposition: A | Discharge status: Home / Self Care | Payer: Medicare HMO | Attending: Gastroenterology | Admitting: Gastroenterology

## 2023-06-12 ENCOUNTER — Encounter
Admission: RE | Disposition: A | Discharge status: Home / Self Care | Payer: Self-pay | Source: Home / Self Care | Attending: Gastroenterology

## 2023-06-12 DIAGNOSIS — Z980 Intestinal bypass and anastomosis status: Secondary | ICD-10-CM | POA: Diagnosis not present

## 2023-06-12 DIAGNOSIS — Z86010 Personal history of colonic polyps: Secondary | ICD-10-CM | POA: Insufficient documentation

## 2023-06-12 DIAGNOSIS — Z9049 Acquired absence of other specified parts of digestive tract: Secondary | ICD-10-CM | POA: Insufficient documentation

## 2023-06-12 DIAGNOSIS — Z1211 Encounter for screening for malignant neoplasm of colon: Secondary | ICD-10-CM | POA: Diagnosis present

## 2023-06-12 DIAGNOSIS — I10 Essential (primary) hypertension: Secondary | ICD-10-CM | POA: Diagnosis not present

## 2023-06-12 DIAGNOSIS — J449 Chronic obstructive pulmonary disease, unspecified: Secondary | ICD-10-CM | POA: Insufficient documentation

## 2023-06-12 DIAGNOSIS — Z8673 Personal history of transient ischemic attack (TIA), and cerebral infarction without residual deficits: Secondary | ICD-10-CM | POA: Insufficient documentation

## 2023-06-12 HISTORY — PX: COLONOSCOPY WITH PROPOFOL: SHX5780

## 2023-06-12 SURGERY — COLONOSCOPY WITH PROPOFOL
Anesthesia: General

## 2023-06-12 MED ORDER — PROPOFOL 500 MG/50ML IV EMUL
INTRAVENOUS | Status: DC | PRN
  Administered 2023-06-12: 100 ug/kg/min via INTRAVENOUS

## 2023-06-12 MED ORDER — PROPOFOL 10 MG/ML IV BOLUS
INTRAVENOUS | Status: DC | PRN
Start: 2023-06-12 — End: 2023-06-12
  Administered 2023-06-12: 50 mg via INTRAVENOUS

## 2023-06-12 MED ORDER — PROPOFOL 10 MG/ML IV BOLUS
INTRAVENOUS | Status: AC
  Filled 2023-06-12: qty 40

## 2023-06-12 MED ORDER — SODIUM CHLORIDE 0.9 % IV SOLN
INTRAVENOUS | Status: DC
  Administered 2023-06-12: 1000 mL via INTRAVENOUS

## 2023-06-12 MED ORDER — STERILE WATER FOR IRRIGATION IR SOLN
Status: DC | PRN
  Administered 2023-06-12: 60 mL

## 2023-06-12 NOTE — Transfer of Care (Signed)
Immediate Anesthesia Transfer of Care Note  Patient: Christopher Macdonald  Procedure(s) Performed: COLONOSCOPY WITH PROPOFOL  Patient Location: PACU  Anesthesia Type:MAC  Level of Consciousness: awake  Airway & Oxygen Therapy: Patient Spontanous Breathing  Post-op Assessment: Report given to RN and Post -op Vital signs reviewed and stable  Post vital signs: Reviewed and stable  Last Vitals:  Vitals Value Taken Time  BP 100/70 06/12/23 1128  Temp 35.8 C 06/12/23 1128  Pulse 62 06/12/23 1129  Resp 20 06/12/23 1129  SpO2 100 % 06/12/23 1129  Vitals shown include unfiled device data.  Last Pain:  Vitals:   06/12/23 1128  TempSrc: Tympanic  PainSc: Asleep         Complications: No notable events documented.

## 2023-06-12 NOTE — Anesthesia Postprocedure Evaluation (Signed)
Anesthesia Post Note  Patient: Christopher Macdonald  Procedure(s) Performed: COLONOSCOPY WITH PROPOFOL  Patient location during evaluation: PACU Anesthesia Type: General Level of consciousness: awake Pain management: pain level controlled Vital Signs Assessment: post-procedure vital signs reviewed and stable Respiratory status: spontaneous breathing Cardiovascular status: stable Anesthetic complications: no   No notable events documented.   Last Vitals:  Vitals:   06/12/23 1138 06/12/23 1148  BP: 132/62 (!) 160/69  Pulse: 61 60  Resp: 12 20  Temp:    SpO2: 99% 100%    Last Pain:  Vitals:   06/12/23 1148  TempSrc:   PainSc: 0-No pain                 VAN STAVEREN,Shalie Schremp

## 2023-06-12 NOTE — Interval H&P Note (Signed)
History and Physical Interval Note:  06/12/2023 11:05 AM  Christopher Macdonald  has presented today for surgery, with the diagnosis of hx of adenomatous polyp of colon.  The various methods of treatment have been discussed with the patient and family. After consideration of risks, benefits and other options for treatment, the patient has consented to  Procedure(s): COLONOSCOPY WITH PROPOFOL (N/A) as a surgical intervention.  The patient's history has been reviewed, patient examined, no change in status, stable for surgery.  I have reviewed the patient's chart and labs.  Questions were answered to the patient's satisfaction.     Regis Bill  Ok to proceed with colonoscopy

## 2023-06-12 NOTE — H&P (Signed)
Outpatient short stay form Pre-procedure 06/12/2023  Christopher Bill, MD  Primary Physician: Rayetta Humphrey, MD  Reason for visit:  Surveillance  History of present illness:    77 y/o gentleman with history of hypertension, CVA on plavix (last dose 5 days ago), and COPD here for surveillance colonoscopy. Last colonoscopy in 2014 with large flat polyp that required right hemicolectomy. Has not had colonoscopy since then. Also with inguinal surgery repair. No family history of GI malignancies.    Current Facility-Administered Medications:    0.9 %  sodium chloride infusion, , Intravenous, Continuous, Patriciann Becht, Rossie Muskrat, MD, Last Rate: 20 mL/hr at 06/12/23 1036, 1,000 mL at 06/12/23 1036  Medications Prior to Admission  Medication Sig Dispense Refill Last Dose   amLODipine (NORVASC) 5 MG tablet Take 5 mg by mouth daily.   06/12/2023   brimonidine (ALPHAGAN) 0.2 % ophthalmic solution Place 30 drops into the right eye in the morning and at bedtime.   06/12/2023   clopidogrel (PLAVIX) 75 MG tablet Take 1 tablet (75 mg total) by mouth daily. 30 tablet 0 06/07/2023   dorzolamide-timolol (COSOPT) 22.3-6.8 MG/ML ophthalmic solution Place 1 drop into the right eye 2 (two) times daily.   06/12/2023   LUMIGAN 0.01 % SOLN Place 1 drop into the right eye daily.   06/12/2023   mirtazapine (REMERON) 7.5 MG tablet Take 7.5 mg by mouth at bedtime.   06/11/2023   nicotine (NICODERM CQ - DOSED IN MG/24 HOURS) 21 mg/24hr patch Place 1 patch (21 mg total) onto the skin daily. 28 patch 0 06/12/2023   albuterol (PROVENTIL HFA;VENTOLIN HFA) 108 (90 Base) MCG/ACT inhaler Inhale 2 puffs into the lungs 2 (two) times daily.       aspirin EC 81 MG tablet Take 81 mg by mouth daily.       losartan (COZAAR) 50 MG tablet Take 50 mg by mouth daily.       magnesium oxide (MAG-OX) 400 MG tablet Take 400 mg by mouth daily. (Patient not taking: Reported on 06/12/2023)   Not Taking     Allergies  Allergen Reactions   Wasp  Venom Swelling     Past Medical History:  Diagnosis Date   COPD (chronic obstructive pulmonary disease) (HCC)    Depression    Elevated PSA    Glaucoma    Hepatitis    age 67.  received treatment   HTN (hypertension)    Lower back pain    Stroke (HCC) 01/06/2021   No Deficits   Tobacco abuse    Wears dentures    full upper and lower    Review of systems:  Otherwise negative.    Physical Exam  Gen: Alert, oriented. Appears stated age.  HEENT: PERRLA. Lungs: No respiratory distress CV: RRR Abd: soft, benign, no masses Ext: No edema    Planned procedures: Proceed with colonoscopy. The patient understands the nature of the planned procedure, indications, risks, alternatives and potential complications including but not limited to bleeding, infection, perforation, damage to internal organs and possible oversedation/side effects from anesthesia. The patient agrees and gives consent to proceed.  Please refer to procedure notes for findings, recommendations and patient disposition/instructions.     Christopher Bill, MD [facility]

## 2023-06-12 NOTE — Anesthesia Preprocedure Evaluation (Signed)
Anesthesia Evaluation  Patient identified by MRN, date of birth, ID band Patient awake    Reviewed: Allergy & Precautions, NPO status , Patient's Chart, lab work & pertinent test results  Airway Mallampati: II  TM Distance: >3 FB Neck ROM: full    Dental  (+) Edentulous Upper, Edentulous Lower   Pulmonary neg pulmonary ROS, COPD,  COPD inhaler, Current Smoker   Pulmonary exam normal  + decreased breath sounds      Cardiovascular Exercise Tolerance: Poor hypertension, Pt. on medications + Peripheral Vascular Disease  negative cardio ROS Normal cardiovascular exam Rhythm:Regular Rate:Normal     Neuro/Psych    Depression    CVA negative neurological ROS  negative psych ROS   GI/Hepatic negative GI ROS, Neg liver ROS,,,(+) Hepatitis -  Endo/Other  negative endocrine ROS    Renal/GU negative Renal ROS  negative genitourinary   Musculoskeletal   Abdominal  (+) + scaphoid   Peds negative pediatric ROS (+)  Hematology negative hematology ROS (+)   Anesthesia Other Findings Past Medical History: No date: COPD (chronic obstructive pulmonary disease) (HCC) No date: Depression No date: Elevated PSA No date: Glaucoma No date: Hepatitis     Comment:  age 77.  received treatment No date: HTN (hypertension) No date: Lower back pain 01/06/2021: Stroke (HCC)     Comment:  No Deficits No date: Tobacco abuse No date: Wears dentures     Comment:  full upper and lower  Past Surgical History: No date: BACK SURGERY 10/27/2021: CATARACT EXTRACTION W/PHACO; Right     Comment:  Procedure: OMNI CANALOPLASTY / TRABECULOTOMY RIGHT;                Surgeon: Lockie Mola, MD;  Location: [facility];  Service: Ophthalmology;  Laterality:               Right; No date: cataract sugery; Right No date: COLONOSCOPY 2015: GLAUCOMA SURGERY 2014: HERNIA REPAIR 05/30/2013: LAPAROSCOPIC PARTIAL COLECTOMY      Comment:  with anastomosis. [facility] No date: TONSILLECTOMY  BMI    Body Mass Index: 15.30 kg/m      Reproductive/Obstetrics negative OB ROS                             Anesthesia Physical Anesthesia Plan  ASA: 3  Anesthesia Plan: General   Post-op Pain Management:    Induction: Intravenous  PONV Risk Score and Plan: Propofol infusion and TIVA  Airway Management Planned: Natural Airway  Additional Equipment:   Intra-op Plan:   Post-operative Plan:   Informed Consent: I have reviewed the patients History and Physical, chart, labs and discussed the procedure including the risks, benefits and alternatives for the proposed anesthesia with the patient or authorized representative who has indicated his/her understanding and acceptance.     Dental Advisory Given  Plan Discussed with: CRNA and Surgeon  Anesthesia Plan Comments:        Anesthesia Quick Evaluation

## 2023-06-12 NOTE — Op Note (Signed)
[facility]astroenterology Patient Name: Christopher Macdonald Procedure Date: 06/12/2023 10:53 AM MRN: [id number] Account #: [id number] Date of Birth: 1946/10/09 Admit Type: Outpatient Age: 77 Room: [facility] ENDO ROOM 3 Gender: Male Note Status: Finalized Instrument Name: Peds Colonoscope [id number] Procedure:             Colonoscopy Indications:           Surveillance: Personal history of adenomatous polyps                         on last colonoscopy > 5 years ago Providers:             Eather Colas MD, MD Referring MD:          Marylin Crosby. Greggory Stallion MD, MD (Referring MD) Medicines:             Monitored Anesthesia Care Complications:         No immediate complications. Procedure:             Pre-Anesthesia Assessment:                        - Prior to the procedure, a History and Physical was                         performed, and patient medications and allergies were                         reviewed. The patient is competent. The risks and                         benefits of the procedure and the sedation options and                         risks were discussed with the patient. All questions                         were answered and informed consent was obtained.                         Patient identification and proposed procedure were                         verified by the physician, the nurse, the                         anesthesiologist, the anesthetist and the technician                         in the endoscopy suite. Mental Status Examination:                         alert and oriented. Airway Examination: normal                         oropharyngeal airway and neck mobility. Respiratory                         Examination: clear to auscultation. CV Examination:  normal. Prophylactic Antibiotics: The patient does not                         require prophylactic antibiotics. Prior                         Anticoagulants: The patient has  taken no anticoagulant                         or antiplatelet agents. ASA Grade Assessment: III - A                         patient with severe systemic disease. After reviewing                         the risks and benefits, the patient was deemed in                         satisfactory condition to undergo the procedure. The                         anesthesia plan was to use monitored anesthesia care                         (MAC). Immediately prior to administration of                         medications, the patient was re-assessed for adequacy                         to receive sedatives. The heart rate, respiratory                         rate, oxygen saturations, blood pressure, adequacy of                         pulmonary ventilation, and response to care were                         monitored throughout the procedure. The physical                         status of the patient was re-assessed after the                         procedure.                        After obtaining informed consent, the colonoscope was                         passed under direct vision. Throughout the procedure,                         the patient's blood pressure, pulse, and oxygen                         saturations were monitored continuously. The  Colonoscope was introduced through the anus and                         advanced to the the ileocolonic anastomosis. The                         colonoscopy was performed without difficulty. The                         patient tolerated the procedure well. The quality of                         the bowel preparation was good. The rectum was                         photographed. Findings:      The perianal and digital rectal examinations were normal.      There was evidence of a prior end-to-side ileo-colonic anastomosis in       the ascending colon. This was patent and was characterized by healthy       appearing mucosa.      The  exam was otherwise without abnormality on direct and retroflexion       views. Impression:            - Patent end-to-side ileo-colonic anastomosis,                         characterized by healthy appearing mucosa.                        - The examination was otherwise normal on direct and                         retroflexion views.                        - No specimens collected. Recommendation:        - Discharge patient to home.                        - Resume previous diet.                        - Continue present medications.                        - Repeat colonoscopy is not recommended due to current                         age (25 years or older) for surveillance.                        - Return to referring physician as previously                         scheduled. Procedure Code(s):     --- Professional ---                        D1761, Colorectal cancer screening; colonoscopy on  individual at high risk Diagnosis Code(s):     --- Professional ---                        Z86.010, Personal history of colonic polyps                        Z98.0, Intestinal bypass and anastomosis status CPT copyright 2022 [employer]. All rights reserved. The codes documented in this report are preliminary and upon coder review may  be revised to meet current compliance requirements. Eather Colas MD, MD 06/12/2023 11:29:57 AM Number of Addenda: 0 Note Initiated On: 06/12/2023 10:53 AM Scope Withdrawal Time: 0 hours 7 minutes 33 seconds  Total Procedure Duration: 0 hours 11 minutes 50 seconds  Estimated Blood Loss:  Estimated blood loss: none.      [facility]

## 2023-06-13 ENCOUNTER — Encounter: Payer: Self-pay | Admitting: Gastroenterology

## 2023-11-22 ENCOUNTER — Encounter: Payer: Self-pay | Admitting: Acute Care
